# Patient Record
Sex: Female | Born: 1995 | Race: Black or African American | Hispanic: No | Marital: Married | Smoking: Never smoker
Health system: Southern US, Community
[De-identification: ages and names within clinical notes are randomized; demographics above are authoritative.]

## PROBLEM LIST (undated history)

## (undated) ENCOUNTER — Inpatient Hospital Stay (HOSPITAL_COMMUNITY): Payer: Self-pay

## (undated) DIAGNOSIS — D649 Anemia, unspecified: Secondary | ICD-10-CM

## (undated) DIAGNOSIS — B999 Unspecified infectious disease: Secondary | ICD-10-CM

## (undated) DIAGNOSIS — L309 Dermatitis, unspecified: Secondary | ICD-10-CM

## (undated) HISTORY — PX: NO PAST SURGERIES: SHX2092

---
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
## 2015-02-01 ENCOUNTER — Inpatient Hospital Stay (HOSPITAL_COMMUNITY)
Admission: AD | Admit: 2015-02-01 | Discharge: 2015-02-01 | Disposition: A | Discharge status: Home / Self Care | Payer: No Typology Code available for payment source | Source: Ambulatory Visit | Attending: Obstetrics & Gynecology | Admitting: Obstetrics & Gynecology

## 2015-02-01 ENCOUNTER — Encounter (HOSPITAL_COMMUNITY): Payer: Self-pay | Admitting: *Deleted

## 2015-02-01 DIAGNOSIS — O26892 Other specified pregnancy related conditions, second trimester: Secondary | ICD-10-CM

## 2015-02-01 DIAGNOSIS — M549 Dorsalgia, unspecified: Secondary | ICD-10-CM | POA: Diagnosis not present

## 2015-02-01 DIAGNOSIS — O26852 Spotting complicating pregnancy, second trimester: Secondary | ICD-10-CM | POA: Diagnosis present

## 2015-02-01 DIAGNOSIS — O9989 Other specified diseases and conditions complicating pregnancy, childbirth and the puerperium: Secondary | ICD-10-CM | POA: Diagnosis not present

## 2015-02-01 HISTORY — DX: Unspecified infectious disease: B99.9

## 2015-02-01 HISTORY — DX: Dermatitis, unspecified: L30.9

## 2015-02-01 HISTORY — DX: Anemia, unspecified: D64.9

## 2015-02-01 LAB — URINALYSIS, ROUTINE W REFLEX MICROSCOPIC
Bilirubin Urine: NEGATIVE
Glucose, UA: NEGATIVE mg/dL
Hgb urine dipstick: NEGATIVE
Ketones, ur: NEGATIVE mg/dL
LEUKOCYTES UA: NEGATIVE
Nitrite: NEGATIVE
PH: 8 (ref 5.0–8.0)
Protein, ur: NEGATIVE mg/dL
Specific Gravity, Urine: 1.02 (ref 1.005–1.030)
UROBILINOGEN UA: 0.2 mg/dL (ref 0.0–1.0)

## 2015-02-01 LAB — ABO/RH: ABO/RH(D): O POS

## 2015-02-01 NOTE — MAU Provider Note (Signed)
History     CSN: [id number]  Arrival date and time: 02/01/15 1147   First Provider Initiated Contact with Patient 02/01/15 1255      Chief Complaint  Patient presents with  . Vaginal Bleeding  . Abdominal Pain   HPI  [name] is a 19 y.o. G2P1002 at [redacted]w[redacted]d who presents to MAU today with complaint of light pink spotting with wiping and sharp mid back pain yesterday. She states 2 episodes of sharp shooting mid back pain yesterday without further recurrence. She also states that she noted light pink spotting on the tissue once yesterday after intercourse. She denies any further bleeding. Last intercourse was yesterday and prior to that a few days before. She denies UTI symptoms or abnormal vaginal discharge. She denies known complications with the pregnancy. She had prenatal care in [place], but recently moved to [facility] and has not seen MD yet.   OB History    Gravida Para Term Preterm AB TAB SAB Ectopic Multiple Living   0 0 0 0 0 0 2      Past Medical History  Diagnosis Date  . Anemia   . Infection     UTI  . Eczema     History reviewed. No pertinent past surgical history.  Family History  Problem Relation Age of Onset  . Heart disease Maternal Grandmother   . Diabetes Maternal Grandfather     History  Substance Use Topics  . Smoking status: Never Smoker   . Smokeless tobacco: Never Used  . Alcohol Use: No    Allergies: Allergies not on file  No prescriptions prior to admission    Review of Systems  Constitutional: Negative for fever and malaise/fatigue.  Gastrointestinal: Negative for nausea, vomiting, abdominal pain, diarrhea and constipation.  Genitourinary: Negative for dysuria, urgency and frequency.       Neg -vaginal bleeding, abnormal discharge   Physical Exam   Blood pressure 92/72, pulse 85, temperature 98.9 F (37.2 C), temperature source Oral, resp. rate 16.  Physical Exam  Constitutional: She is oriented to person, place, and time.  She appears well-developed. No distress.  HENT:  Head: Normocephalic.  Cardiovascular: Normal rate.   Respiratory: Effort normal.  GI: Soft. Bowel sounds are normal. She exhibits no distension and no mass. There is no tenderness. There is no rebound, no guarding and no CVA tenderness.  Musculoskeletal:       Thoracic back: She exhibits normal range of motion, no tenderness, no bony tenderness, no swelling, no edema and no pain.  Neurological: She is alert and oriented to person, place, and time.  Skin: Skin is warm. No erythema.  Psychiatric: She has a normal mood and affect.  Dilation: Closed Effacement (%): Thick Cervical Position: Posterior Exam by:: Raynelle Fanning PA  Results for orders placed or performed during the hospital encounter of 02/01/15 (from the past 24 hour(s))  Urinalysis, Routine w reflex microscopic     Status: None   Collection Time: 02/01/15 12:05 PM  Result Value Ref Range   Color, Urine YELLOW YELLOW   APPearance CLEAR CLEAR   Specific Gravity, Urine 1.020 1.005 - 1.030   pH 8.0 5.0 - 8.0   Glucose, UA NEGATIVE NEGATIVE mg/dL   Hgb urine dipstick NEGATIVE NEGATIVE   Bilirubin Urine NEGATIVE NEGATIVE   Ketones, ur NEGATIVE NEGATIVE mg/dL   Protein, ur NEGATIVE NEGATIVE mg/dL   Urobilinogen, UA 0.2 0.0 - 1.0 mg/dL   Nitrite NEGATIVE NEGATIVE   Leukocytes, UA  NEGATIVE NEGATIVE    MAU Course  Procedures None  MDM FHR - 147 bpm with doppler ABO/Rh and UA today  Assessment and Plan  A: SIUP at 4353w1d Back pain in pregnancy  P: Discharge home Advised Tylenol PRN for pain Second trimester warning signs discussed [facility] for [facility] due to GA. They will call the patient with an appointment Patient may return to MAU as needed or if her condition were to change or worsen    Marny LowensteinJulie N Irvine Glorioso, PA-C  02/01/2015, 1:58 PM

## 2015-02-01 NOTE — MAU Note (Signed)
Spotting and cramping yesterday, none today.  Denies prior hx of bleeding.  This is first episode.  Only US was in Jan (early pregnancy)

## 2015-02-01 NOTE — MAU Note (Signed)
R.O.I obtained, attempting to get through to office, for fax number

## 2015-02-01 NOTE — MAU Note (Signed)
Pt C/O mild uc's last night & spotting, denies these sx's today.  [place] moved [place], [facility] [place].

## 2015-02-01 NOTE — Discharge Instructions (Signed)

## 2015-02-16 ENCOUNTER — Encounter: Payer: PRIVATE HEALTH INSURANCE | Admitting: Advanced Practice Midwife

## 2015-02-16 ENCOUNTER — Telehealth: Payer: Self-pay | Admitting: Advanced Practice Midwife

## 2015-02-16 ENCOUNTER — Encounter: Payer: Self-pay | Admitting: Advanced Practice Midwife

## 2015-02-16 NOTE — Telephone Encounter (Signed)
Called left message for patient to return call to clinic. Patient missed her first prenatal visit, mailing certified letter to patient,

## 2015-02-22 ENCOUNTER — Encounter: Payer: Self-pay | Admitting: General Practice

## 2015-03-16 ENCOUNTER — Telehealth: Payer: Self-pay | Admitting: *Deleted

## 2015-03-16 ENCOUNTER — Ambulatory Visit (INDEPENDENT_AMBULATORY_CARE_PROVIDER_SITE_OTHER): Payer: PRIVATE HEALTH INSURANCE | Admitting: Advanced Practice Midwife

## 2015-03-16 ENCOUNTER — Encounter: Payer: Self-pay | Admitting: Advanced Practice Midwife

## 2015-03-16 VITALS — BP 92/57 | HR 96 | Ht 64.0 in | Wt 137.0 lb

## 2015-03-16 DIAGNOSIS — O0932 Supervision of pregnancy with insufficient antenatal care, second trimester: Secondary | ICD-10-CM

## 2015-03-16 DIAGNOSIS — B3731 Acute candidiasis of vulva and vagina: Secondary | ICD-10-CM

## 2015-03-16 DIAGNOSIS — O0990 Supervision of high risk pregnancy, unspecified, unspecified trimester: Secondary | ICD-10-CM | POA: Insufficient documentation

## 2015-03-16 DIAGNOSIS — B373 Candidiasis of vulva and vagina: Secondary | ICD-10-CM

## 2015-03-16 DIAGNOSIS — O98812 Other maternal infectious and parasitic diseases complicating pregnancy, second trimester: Secondary | ICD-10-CM

## 2015-03-16 DIAGNOSIS — Z3482 Encounter for supervision of other normal pregnancy, second trimester: Secondary | ICD-10-CM

## 2015-03-16 LAB — POCT URINALYSIS DIP (DEVICE)
BILIRUBIN URINE: NEGATIVE
Glucose, UA: NEGATIVE mg/dL
HGB URINE DIPSTICK: NEGATIVE
Ketones, ur: NEGATIVE mg/dL
Nitrite: NEGATIVE
Protein, ur: 30 mg/dL — AB
Specific Gravity, Urine: 1.025 (ref 1.005–1.030)
Urobilinogen, UA: 0.2 mg/dL (ref 0.0–1.0)
pH: 7 (ref 5.0–8.0)

## 2015-03-16 MED ORDER — TERCONAZOLE 0.4 % VA CREA: 1.0000 | TOPICAL_CREAM | Freq: Every day | VAGINAL | Status: DC

## 2015-03-16 NOTE — Progress Notes (Signed)
Flu shot declined. Had one prenatal visit in [place] in December.

## 2015-03-16 NOTE — Progress Notes (Signed)
Large leuks in urine.  

## 2015-03-16 NOTE — Telephone Encounter (Signed)
Called pt to inform her of US appt scheduled and was unable to leave message because her mailbox is full.  Pt has US scheduled on 5/2 @ 1330 and needs to be notified.

## 2015-03-16 NOTE — Patient Instructions (Signed)
Thinking About Waterbirth???  You must attend a Waterbirth class at [facility]  3rd Wednesday of every month from 7-9pm  Free  Register by calling [phone] or online at [email or web address]  Bring us the certificate from the class  Waterbirth supplies needed for [facility] patients:  Our practice has a Birth Pool in a Box tub at the hospital that you can borrow  You will need to purchase an accessory kit that has all needed supplies through [facility] (  ) or online  Or you can purchase the supplies separately: o Single-use disposable tub liner for Birth Pool in a Box (REGULAR size) o New garden hose labeled "lead-free", "suitable for drinking water", "non-toxic" OR "water potable" o Garden hose to remove the dirty water o Electric drain pump to remove water (We recommend 792 gallon per hour or greater pump.)  o Fish net o Bathing suit top (optional) o Long-handled mirror (optional)  [email or web address] sells tubs for ~ $120 if you would rather purchase your own tub  [employer] [facility] ([email or web address]) $275 for tub rental/set-up & take down/kit   Things that would prevent you from having a waterbirth:  Premature, <37wks  Previous cesarean birth  Presence of thick meconium-stained fluid  Multiple gestation (Twins, triplets, etc.)  Uncontrolled diabetes  Hypertension  Heavy vaginal bleeding  Non-reassuring fetal heart rate  Active infection (MRSA, etc.)  If your labor has to be induced  Other risk issues identified by your obstetrical provider    

## 2015-03-16 NOTE — Progress Notes (Signed)
   Subjective:    Cecelia ByarsBrittany N Silfies is a G2P1001 7967w2d being seen today for her first obstetrical visit.  Her obstetrical history is significant for NSVD x 1 . Patient does intend to breast feed. Pregnancy history fully reviewed.  Patient reports vaginal irritation and itching with white discharge.  Pt suspects yeast..  Filed Vitals:   03/16/15 1027 03/16/15 1029  BP: 92/57   Pulse: 96   Height:  5\' 4"  (1.626 m)  Weight: 137 lb (62.143 kg)     HISTORY: OB History  Gravida Para Term Preterm AB SAB TAB Ectopic Multiple Living  2 1 1  0 0 0 0 0 0 1    # Outcome Date GA Lbr Len/2nd Weight Sex Delivery Anes PTL Lv  2 Current           1 Term    7 lb 14 oz (3.572 kg) M Vag-Spont EPI N Y     Past Medical History  Diagnosis Date  . Anemia   . Infection     UTI  . Eczema    History reviewed. No pertinent past surgical history. Family History  Problem Relation Age of Onset  . Heart disease Maternal Grandmother   . Diabetes Maternal Grandfather      Exam    Uterus:     Pelvic Exam: Deferred r/t age  System: Breast:  normal appearance, no masses or tenderness   Skin: normal coloration and turgor, no rashes    Neurologic: oriented, normal, gait normal; reflexes normal and symmetric   Extremities: normal strength, tone, and muscle mass, ROM of all joints is normal   HEENT sclera clear, anicteric, neck supple with midline trachea and thyroid without masses   Mouth/Teeth mucous membranes moist, pharynx normal without lesions and dental hygiene good   Neck supple and no masses   Cardiovascular:    Respiratory:  appears well, vitals normal, no respiratory distress, acyanotic, normal RR, ear and throat exam is normal, neck free of mass or lymphadenopathy   Abdomen: soft, non-tender; bowel sounds normal; no masses,  no organomegaly   Urinary: not evaluated      Assessment:    Pregnancy: G2P1001 There are no active problems to display for this patient.       Plan:      Initial labs drawn. Prenatal vitamins. Terazol 7 sent to pharmacy Problem list reviewed and updated. Genetic Screening discussed : late to care.  Ultrasound discussed; fetal survey: ordered.  Follow up in 4 weeks.    LEFTWICH-KIRBY, Pandora Mccrackin 03/16/2015

## 2015-03-17 LAB — PRENATAL PROFILE (SOLSTAS)
ANTIBODY SCREEN: NEGATIVE
BASOS ABS: 0 10*3/uL (ref 0.0–0.1)
Basophils Relative: 0 % (ref 0–1)
EOS PCT: 1 % (ref 0–5)
Eosinophils Absolute: 0.1 10*3/uL (ref 0.0–0.7)
HCT: 29.1 % — ABNORMAL LOW (ref 36.0–46.0)
HEMOGLOBIN: 9.7 g/dL — AB (ref 12.0–15.0)
HIV 1&2 Ab, 4th Generation: NONREACTIVE
Hepatitis B Surface Ag: NEGATIVE
Lymphocytes Relative: 20 % (ref 12–46)
Lymphs Abs: 1.8 10*3/uL (ref 0.7–4.0)
MCH: 25.9 pg — AB (ref 26.0–34.0)
MCHC: 33.3 g/dL (ref 30.0–36.0)
MCV: 77.6 fL — ABNORMAL LOW (ref 78.0–100.0)
MPV: 10.2 fL (ref 8.6–12.4)
Monocytes Absolute: 0.5 10*3/uL (ref 0.1–1.0)
Monocytes Relative: 6 % (ref 3–12)
NEUTROS PCT: 73 % (ref 43–77)
Neutro Abs: 6.6 10*3/uL (ref 1.7–7.7)
PLATELETS: 295 10*3/uL (ref 150–400)
RBC: 3.75 MIL/uL — AB (ref 3.87–5.11)
RDW: 13.6 % (ref 11.5–15.5)
Rh Type: POSITIVE
Rubella: 2.01 Index — ABNORMAL HIGH (ref <0.90)
WBC: 9.1 10*3/uL (ref 4.0–10.5)

## 2015-03-17 NOTE — Telephone Encounter (Signed)
Called patient and informed her of appt. Patient verbalized understanding and had no questions 

## 2015-03-18 LAB — PRESCRIPTION MONITORING PROFILE (19 PANEL)
Amphetamine/Meth: NEGATIVE ng/mL
Barbiturate Screen, Urine: NEGATIVE ng/mL
Benzodiazepine Screen, Urine: NEGATIVE ng/mL
Buprenorphine, Urine: NEGATIVE ng/mL
COCAINE METABOLITES: NEGATIVE ng/mL
Cannabinoid Scrn, Ur: NEGATIVE ng/mL
Carisoprodol, Urine: NEGATIVE ng/mL
Creatinine, Urine: 271.38 mg/dL (ref >20.0)
Fentanyl, Ur: NEGATIVE ng/mL
MDMA URINE: NEGATIVE ng/mL
METHADONE SCREEN, URINE: NEGATIVE ng/mL
Meperidine, Ur: NEGATIVE ng/mL
Methaqualone: NEGATIVE ng/mL
Nitrites, Initial: NEGATIVE ug/mL
OXYCODONE SCRN UR: NEGATIVE ng/mL
Opiate Screen, Urine: NEGATIVE ng/mL
PHENCYCLIDINE, UR: NEGATIVE ng/mL
PROPOXYPHENE: NEGATIVE ng/mL
TAPENTADOLUR: NEGATIVE ng/mL
Tramadol Scrn, Ur: NEGATIVE ng/mL
ZOLPIDEM, URINE: NEGATIVE ng/mL
pH, Initial: 7.3 pH (ref 4.5–8.9)

## 2015-03-18 LAB — CULTURE, OB URINE

## 2015-03-21 ENCOUNTER — Ambulatory Visit (HOSPITAL_COMMUNITY)
Admission: RE | Admit: 2015-03-21 | Discharge: 2015-03-21 | Disposition: A | Discharge status: Home / Self Care | Payer: Self-pay | Source: Ambulatory Visit | Attending: Advanced Practice Midwife | Admitting: Advanced Practice Midwife

## 2015-03-21 DIAGNOSIS — Z3A25 25 weeks gestation of pregnancy: Secondary | ICD-10-CM | POA: Insufficient documentation

## 2015-03-21 DIAGNOSIS — O0932 Supervision of pregnancy with insufficient antenatal care, second trimester: Secondary | ICD-10-CM | POA: Insufficient documentation

## 2015-03-21 DIAGNOSIS — Z36 Encounter for antenatal screening of mother: Secondary | ICD-10-CM | POA: Insufficient documentation

## 2015-03-21 DIAGNOSIS — Z3689 Encounter for other specified antenatal screening: Secondary | ICD-10-CM | POA: Insufficient documentation

## 2015-04-06 ENCOUNTER — Ambulatory Visit (INDEPENDENT_AMBULATORY_CARE_PROVIDER_SITE_OTHER): Payer: PRIVATE HEALTH INSURANCE | Admitting: Physician Assistant

## 2015-04-06 ENCOUNTER — Encounter: Payer: Self-pay | Admitting: Physician Assistant

## 2015-04-06 VITALS — BP 109/81 | HR 92 | Wt 140.9 lb

## 2015-04-06 DIAGNOSIS — Z3483 Encounter for supervision of other normal pregnancy, third trimester: Secondary | ICD-10-CM

## 2015-04-06 LAB — POCT URINALYSIS DIP (DEVICE)
BILIRUBIN URINE: NEGATIVE
Glucose, UA: NEGATIVE mg/dL
Hgb urine dipstick: NEGATIVE
Ketones, ur: NEGATIVE mg/dL
Nitrite: NEGATIVE
PH: 6.5 (ref 5.0–8.0)
PROTEIN: NEGATIVE mg/dL
SPECIFIC GRAVITY, URINE: 1.02 (ref 1.005–1.030)
Urobilinogen, UA: 0.2 mg/dL (ref 0.0–1.0)

## 2015-04-06 LAB — CBC
HCT: 29.9 % — ABNORMAL LOW (ref 36.0–46.0)
HEMOGLOBIN: 9.8 g/dL — AB (ref 12.0–15.0)
MCH: 25.6 pg — ABNORMAL LOW (ref 26.0–34.0)
MCHC: 32.8 g/dL (ref 30.0–36.0)
MCV: 78.1 fL (ref 78.0–100.0)
MPV: 10.3 fL (ref 8.6–12.4)
PLATELETS: 237 10*3/uL (ref 150–400)
RBC: 3.83 MIL/uL — ABNORMAL LOW (ref 3.87–5.11)
RDW: 14.9 % (ref 11.5–15.5)
WBC: 8.9 10*3/uL (ref 4.0–10.5)

## 2015-04-06 MED ORDER — TETANUS-DIPHTH-ACELL PERTUSSIS 5-2.5-18.5 LF-MCG/0.5 IM SUSP: 0.5000 mL | Freq: Once | INTRAMUSCULAR | Status: DC

## 2015-04-06 NOTE — Patient Instructions (Signed)
Pain Relief During Labor and Delivery Everyone experiences pain differently, but labor causes severe pain for many women. The amount of pain you experience during labor and delivery depends on your pain tolerance, contraction strength, and your baby's size and position. There are many ways to prepare for and deal with the pain, including:   Taking prenatal classes to learn about labor and delivery. The more informed you are, the less anxious and afraid you may be. This can help lessen the pain.  Taking pain-relieving medicine during labor and delivery.  Learning breathing and relaxation techniques.  Taking a shower or bath.  Getting massaged.  Changing positions.  Placing an ice pack on your back. Discuss your pain control options with your health care provider during your prenatal visits.  WHAT ARE THE TWO TYPES OF PAIN-RELIEVING MEDICINES? 1. Analgesics. These are medicines that decrease pain without total loss of feeling or muscle movement. 2. Anesthetics. These are medicines that block all feeling, including pain. There can be minor side effects of both types, such as nausea, trouble concentrating, becoming sleepy, and lowering the heart rate of the baby. However, health care providers are careful to give doses that will not seriously affect the baby.  WHAT ARE THE SPECIFIC TYPES OF ANALGESICS AND ANESTHETICS? Systemic Analgesic Systemic pain medicines affect your whole body rather than focusing pain relief on the area of your body experiencing pain. This type of medicine is given either through an IV tube in your vein or by a shot (injection) into your muscle. This medicine will lessen your pain but will not stop it completely. It may also make you sleepy, but it will not make you lose consciousness.  Local Anesthetic Local anesthetic isused tonumb a small area of your body. The medicine is injected into the area of nerves that carry feeling to the vagina, vulva, or the area between  the vagina and anus (perineum).  General Anesthetic This type of medicine causes you to lose consciousness so you do not feel pain. It is usually used only in emergency situations during labor. It is given through an IV tube or face mask. Paracervical Block A paracervical block is a form of local anesthesia given during labor. Numbing medicine is injected into the right and left sides of the cervix and vagina. It helps to lessen the pain caused by contractions and stretching of the cervix. It may have to be given more than once.  Pudendal Block A pudendal block is another form of local anesthesia. It is used to relieve the pain associated with pushing or stretching of the perineum at the time of delivery. An injection is given deep through the vaginal wall into the pudendal nerve in the pelvis, numbing the perineum.  Epidural Anesthetic An epidural is an injection of numbing medicine given in the lower back and into the epidural space near your spinal cord. The epidural numbs the lower half of your body. You may be able to move your legs but will not be allowed to walk. Epidurals can be used for labor, delivery, or cesarean deliveries.  To prevent the medicine from wearing off, a small tube (catheter) may be threaded into the epidural space and taped in place to prevent it from slipping out. Medicine can then be given continuously in small doses through the tube until you deliver. Spinal Block A spinal block is similar to an epidural, but the medicine is injected into the spinal fluid, not the epidural space. A spinal block is only given   once. It starts to relieve pain quickly but lasts only 1-2 hours. Spinal blocks can also be used for cesarean deliveries.  Combined Spinal-Epidural Block Combined spinal-epidural blocks combine the benefits of both the spinal and epidural blocks. The spinal part acts quickly to relieve pain and the epidural provides continuous pain relief. Hydrotherapy Immersion in  warm water during labor may provide comfort and relaxation. It may also help to lessen pain, the use of anesthesia, and the length of labor. However, immersion in water during the delivery (water birth) may have some risk involved and studies to determine safety and risks are ongoing. If you are a healthy woman who is expecting an uncomplicated birth, talk with your health care provider to see if water birth is an option for you.  Document Released: 02/21/2009 Document Revised: 11/10/2013 Document Reviewed: 03/26/2013 ExitCare Patient Information 2015 [employer]. This information is not intended to replace advice given to you by your health care provider. Make sure you discuss any questions you have with your health care provider.  

## 2015-04-06 NOTE — Progress Notes (Signed)
Small leuks on udip.   1hr GTT, 28 wk labs and Tdap today.

## 2015-04-06 NOTE — Progress Notes (Signed)
27 weeks, stable.  Denies LOF, VB, dysuria.  Endorses good fetal movement.  Occasionally with mild ctx.  28 week labs, Tdap today  Advised to sign up for waterbirth class asap.   RTC 2 weeks

## 2015-04-07 LAB — RPR

## 2015-04-07 LAB — HIV ANTIBODY (ROUTINE TESTING W REFLEX): HIV 1&2 Ab, 4th Generation: NONREACTIVE

## 2015-04-07 LAB — GLUCOSE TOLERANCE, 1 HOUR (50G) W/O FASTING: Glucose, 1 Hour GTT: 157 mg/dL — ABNORMAL HIGH (ref 70–140)

## 2015-04-12 ENCOUNTER — Telehealth: Payer: Self-pay

## 2015-04-12 NOTE — Telephone Encounter (Signed)
-----   Message from Bertram DenverKaren E Teague Clark, PA-C sent at 04/12/2015  4:01 PM EDT ----- Please call pt to inform of failed 1 hour GTT.  Will need 3 hour GTT scheduled KTC

## 2015-04-12 NOTE — Telephone Encounter (Signed)
Attempted to contact patient. No answer. Left message stating we are calling with results, please call clinic.  

## 2015-04-13 NOTE — Telephone Encounter (Signed)
Called patient and informed her of 1 hr gtt and need for 3 hr glucose test. Explained she will need to come in fasting (nothing to eat or drink after midnight) and she will need to arrive around 8-830. Patient verbalized understanding and states she can come in Tuesday 5/31 @ 8am. Patient had no questions

## 2015-04-19 ENCOUNTER — Other Ambulatory Visit: Payer: PRIVATE HEALTH INSURANCE

## 2015-04-20 ENCOUNTER — Encounter: Payer: PRIVATE HEALTH INSURANCE | Admitting: Physician Assistant

## 2015-04-21 ENCOUNTER — Encounter: Payer: Self-pay | Admitting: Family Medicine

## 2015-05-03 ENCOUNTER — Other Ambulatory Visit: Payer: Self-pay

## 2015-05-06 ENCOUNTER — Ambulatory Visit (INDEPENDENT_AMBULATORY_CARE_PROVIDER_SITE_OTHER): Payer: PRIVATE HEALTH INSURANCE | Admitting: Family Medicine

## 2015-05-06 VITALS — BP 94/76 | HR 82 | Temp 98.5°F | Wt 142.7 lb

## 2015-05-06 DIAGNOSIS — Z3483 Encounter for supervision of other normal pregnancy, third trimester: Secondary | ICD-10-CM

## 2015-05-06 LAB — POCT URINALYSIS DIP (DEVICE)
Glucose, UA: NEGATIVE mg/dL
Hgb urine dipstick: NEGATIVE
Ketones, ur: NEGATIVE mg/dL
Nitrite: NEGATIVE
Protein, ur: 30 mg/dL — AB
SPECIFIC GRAVITY, URINE: 1.025 (ref 1.005–1.030)
UROBILINOGEN UA: 1 mg/dL (ref 0.0–1.0)
pH: 7 (ref 5.0–8.0)

## 2015-05-06 NOTE — Progress Notes (Signed)
Patient is 19 y.o. G2P1001 [redacted]w[redacted]d.  +FM, denies LOF, VB, contractions, vaginal discharge.  - leg cramps: discussed PO hydration - abn 1h gtt: coming on Monday to be instructed in fasting glucose x 1 week - still has not signed up for waterbirth class, advised to do so.  Needs to be consented by midwife as well.

## 2015-05-06 NOTE — Progress Notes (Signed)
C/o calves cramping.

## 2015-05-09 ENCOUNTER — Ambulatory Visit: Payer: Self-pay

## 2015-05-18 ENCOUNTER — Encounter: Payer: Self-pay | Admitting: Advanced Practice Midwife

## 2015-05-18 ENCOUNTER — Ambulatory Visit (INDEPENDENT_AMBULATORY_CARE_PROVIDER_SITE_OTHER): Payer: Self-pay | Admitting: Advanced Practice Midwife

## 2015-05-18 VITALS — BP 100/68 | HR 83 | Temp 97.7°F | Wt 143.6 lb

## 2015-05-18 DIAGNOSIS — R7302 Impaired glucose tolerance (oral): Secondary | ICD-10-CM

## 2015-05-18 DIAGNOSIS — R7309 Other abnormal glucose: Secondary | ICD-10-CM

## 2015-05-18 DIAGNOSIS — Z3483 Encounter for supervision of other normal pregnancy, third trimester: Secondary | ICD-10-CM

## 2015-05-18 LAB — POCT URINALYSIS DIP (DEVICE)
BILIRUBIN URINE: NEGATIVE
Glucose, UA: NEGATIVE mg/dL
HGB URINE DIPSTICK: NEGATIVE
Ketones, ur: NEGATIVE mg/dL
Nitrite: NEGATIVE
PH: 6 (ref 5.0–8.0)
Protein, ur: NEGATIVE mg/dL
SPECIFIC GRAVITY, URINE: 1.015 (ref 1.005–1.030)
Urobilinogen, UA: 0.2 mg/dL (ref 0.0–1.0)

## 2015-05-18 NOTE — Progress Notes (Signed)
Medical, Surgical, Family and Social histories reviewed and are listed above.  Medications and allergies reviewed.  Has not checked FBS yet, has not had training. Will schedule her with Diab management this week. Will check growth US next week in case she is diabetic. Will scan waterbirth certificate.   Informed her we might not be able to do WB if she is diabetic.

## 2015-05-18 NOTE — Patient Instructions (Signed)
Glucose Tolerance Test During Pregnancy The glucose tolerance test (GTT) or 3-hour glucose test can be used to determine if a woman has diabetes that first begins or is first recognized during pregnancy (gestational diabetes). Typically, a GTT is done after you have had a 1-hour glucose test with results that indicate you possibly have gestational diabetes.  The test takes about 3 hours. There will be a series of blood tests after you drink the sugar-water solution. You must remain at the testing location to make sure that your blood is drawn on time.  LET YOUR HEALTH CARE PROVIDER KNOW ABOUT:  Allergies to food or medicine.  Medicines taken, including vitamins, herbs, eyedrops, over-the-counter medicines, and creams.  Any recent illnesses or infections. BEFORE THE PROCEDURE The GTT is a fasting test, meaning you must stop eating for a certain amount of time. The test will be the most accurate if you have not eaten for 8-12 hours before the test. For this reason, it is recommended that you have this test done in the morning before you have breakfast. PROCEDURE  When you arrive at the lab, a sample of your blood is taken to get your fasting blood glucose level. After your fasting glucose level is determined, you will be given a sugar-water solution to drink. You will be asked to wait in a certain area until your next blood test. The blood tests are done each hour for 3 hours. Stay close to the lab so your blood samples can be taken on time. This is important. If the blood samples are not taken on time, the test will need to be done again on another day.  AFTER THE PROCEDURE  You can eat and drink as usual.   Ask when your test results will be ready. Make sure you get your test results. A positive test is considered when two of the four blood test values are equal or above the normal blood glucose level. Document Released: 05/06/2012 Document Revised: 03/22/2014 Document Reviewed:  03/12/2014 ExitCare Patient Information 2015 [employer]. This information is not intended to replace advice given to you by your health care provider. Make sure you discuss any questions you have with your health care provider.  

## 2015-05-18 NOTE — Progress Notes (Signed)
Is doing cbg's at home, is supposed to reschedule appointment with diabetes educator- Patient states can't come this Thursday, but agreed to 05/26/15 appointment with Diabetes educator in MFM.

## 2015-05-26 ENCOUNTER — Ambulatory Visit (HOSPITAL_COMMUNITY): Payer: Self-pay

## 2015-05-26 ENCOUNTER — Ambulatory Visit (HOSPITAL_COMMUNITY)
Admission: RE | Admit: 2015-05-26 | Discharge: 2015-05-26 | Disposition: A | Discharge status: Home / Self Care | Payer: Medicaid Other | Source: Ambulatory Visit | Attending: Advanced Practice Midwife | Admitting: Advanced Practice Midwife

## 2015-05-26 ENCOUNTER — Other Ambulatory Visit: Payer: Self-pay | Admitting: Advanced Practice Midwife

## 2015-05-26 DIAGNOSIS — Z3A34 34 weeks gestation of pregnancy: Secondary | ICD-10-CM | POA: Insufficient documentation

## 2015-05-26 DIAGNOSIS — O403XX1 Polyhydramnios, third trimester, fetus 1: Secondary | ICD-10-CM

## 2015-05-26 DIAGNOSIS — Z3483 Encounter for supervision of other normal pregnancy, third trimester: Secondary | ICD-10-CM

## 2015-05-26 DIAGNOSIS — IMO0002 Reserved for concepts with insufficient information to code with codable children: Secondary | ICD-10-CM

## 2015-05-26 DIAGNOSIS — O0933 Supervision of pregnancy with insufficient antenatal care, third trimester: Secondary | ICD-10-CM | POA: Diagnosis not present

## 2015-05-26 DIAGNOSIS — O24419 Gestational diabetes mellitus in pregnancy, unspecified control: Secondary | ICD-10-CM | POA: Insufficient documentation

## 2015-05-26 DIAGNOSIS — O409XX0 Polyhydramnios, unspecified trimester, not applicable or unspecified: Secondary | ICD-10-CM | POA: Insufficient documentation

## 2015-05-26 DIAGNOSIS — O24410 Gestational diabetes mellitus in pregnancy, diet controlled: Secondary | ICD-10-CM | POA: Insufficient documentation

## 2015-05-26 DIAGNOSIS — O99810 Abnormal glucose complicating pregnancy: Secondary | ICD-10-CM | POA: Insufficient documentation

## 2015-05-27 ENCOUNTER — Ambulatory Visit (HOSPITAL_COMMUNITY): Payer: Self-pay

## 2015-05-29 DIAGNOSIS — IMO0002 Reserved for concepts with insufficient information to code with codable children: Secondary | ICD-10-CM | POA: Insufficient documentation

## 2015-05-30 ENCOUNTER — Telehealth: Payer: Self-pay | Admitting: General Practice

## 2015-05-30 DIAGNOSIS — IMO0002 Reserved for concepts with insufficient information to code with codable children: Secondary | ICD-10-CM

## 2015-05-30 NOTE — Telephone Encounter (Signed)
-----   Message from Aviva SignsMarie L Williams, CNM sent at 05/29/2015 12:41 PM EDT ----- Regarding: Needs MFM appt for f/u US US saw Pericardial Effusion on US with Polyhydramnios  Needs F/U US in MFM this week (per MFM) to check effusion, may need further workup like fetal echo  Needs to move to High Risk   Thanks  Wynelle BourgeoisMarie Williams

## 2015-05-30 NOTE — Telephone Encounter (Signed)
Scheduled appt for 7/13 @ 1:15. Called patient and informed her of appt. Patient verbalized understanding and had no questions

## 2015-06-01 ENCOUNTER — Ambulatory Visit (HOSPITAL_COMMUNITY)
Admission: RE | Admit: 2015-06-01 | Discharge: 2015-06-01 | Disposition: A | Discharge status: Home / Self Care | Payer: Medicaid Other | Source: Ambulatory Visit | Attending: Advanced Practice Midwife | Admitting: Advanced Practice Midwife

## 2015-06-01 ENCOUNTER — Other Ambulatory Visit: Payer: Self-pay | Admitting: General Practice

## 2015-06-01 ENCOUNTER — Encounter (HOSPITAL_COMMUNITY): Payer: Self-pay

## 2015-06-01 VITALS — BP 101/63 | HR 91 | Wt 148.4 lb

## 2015-06-01 DIAGNOSIS — IMO0002 Reserved for concepts with insufficient information to code with codable children: Secondary | ICD-10-CM

## 2015-06-01 DIAGNOSIS — O403XX0 Polyhydramnios, third trimester, not applicable or unspecified: Secondary | ICD-10-CM | POA: Insufficient documentation

## 2015-06-01 DIAGNOSIS — O403XX1 Polyhydramnios, third trimester, fetus 1: Secondary | ICD-10-CM

## 2015-06-01 DIAGNOSIS — O0933 Supervision of pregnancy with insufficient antenatal care, third trimester: Secondary | ICD-10-CM | POA: Diagnosis not present

## 2015-06-01 DIAGNOSIS — O24419 Gestational diabetes mellitus in pregnancy, unspecified control: Secondary | ICD-10-CM | POA: Diagnosis not present

## 2015-06-01 DIAGNOSIS — O24410 Gestational diabetes mellitus in pregnancy, diet controlled: Secondary | ICD-10-CM

## 2015-06-01 DIAGNOSIS — Z3A35 35 weeks gestation of pregnancy: Secondary | ICD-10-CM | POA: Diagnosis not present

## 2015-06-01 NOTE — ED Notes (Signed)
Pt states she had previously declined her 3hr GTT.  States she did not want it.  She also states she has not been doing cbgs at home.  States she was waiting on diabetic educator to learn this.  She is scheduled to see Harriett SineNancy tomorrow for diabetes education.  I called the clinic and spoke with Dr. Adrian BlackwaterStinson and requested a BPP today with the patients diagnosis of poly and unsure of diabetes status.

## 2015-06-02 ENCOUNTER — Ambulatory Visit (HOSPITAL_COMMUNITY)
Admission: RE | Admit: 2015-06-02 | Discharge: 2015-06-02 | Disposition: A | Discharge status: Home / Self Care | Payer: Medicaid Other | Source: Ambulatory Visit | Attending: Advanced Practice Midwife | Admitting: Advanced Practice Midwife

## 2015-06-02 ENCOUNTER — Encounter: Payer: Medicaid Other | Attending: Advanced Practice Midwife | Admitting: *Deleted

## 2015-06-02 ENCOUNTER — Encounter: Payer: Self-pay | Admitting: Obstetrics and Gynecology

## 2015-06-02 DIAGNOSIS — Z713 Dietary counseling and surveillance: Secondary | ICD-10-CM | POA: Insufficient documentation

## 2015-06-02 DIAGNOSIS — O24410 Gestational diabetes mellitus in pregnancy, diet controlled: Secondary | ICD-10-CM

## 2015-06-02 MED ORDER — GLUCOSE BLOOD VI STRP: ORAL_STRIP | Status: DC

## 2015-06-02 MED ORDER — ACCU-CHEK FASTCLIX LANCETS MISC: 1.0000 | Freq: Four times a day (QID) | Status: DC

## 2015-06-02 NOTE — Progress Notes (Signed)
  Patient was seen on 06/02/15 for Gestational Diabetes self-management . The following learning objectives were met by the patient :   States the definition of Gestational Diabetes  States why dietary management is important in controlling blood glucose  Describes the effects of carbohydrates on blood glucose levels  Demonstrates ability to create a balanced meal plan  Demonstrates carbohydrate counting   States when to check blood glucose levels  Demonstrates proper blood glucose monitoring techniques  States the effect of stress and exercise on blood glucose levels  States the importance of limiting caffeine and abstaining from alcohol and smoking  Plan:  Aim for 2 Carb Choices per meal (30 grams) +/- 1 either way for breakfast Aim for 3 Carb Choices per meal (45 grams) +/- 1 either way from lunch and dinner Aim for 1-2 Carbs per snack Begin reading food labels for Total Carbohydrate and sugar grams of foods Consider  increasing your activity level by walking daily as tolerated Begin checking BG before breakfast and 2 hours after first bit of breakfast, lunch and dinner after  as directed by MD  Take medication  as directed by MD  Blood glucose monitor given: [employer] Lot # [id number] Exp: 03/18/2016 Blood glucose reading: 121m/dl 310m pp granola bar  Patient instructed to monitor glucose levels: FBS: 60 - <90 2 hour: <120  Patient received the following handouts:  Nutrition Diabetes and Pregnancy  Carbohydrate Counting List  Meal Planning worksheet  Patient will be seen for follow-up as needed.

## 2015-06-11 ENCOUNTER — Encounter (HOSPITAL_COMMUNITY): Payer: Self-pay | Admitting: *Deleted

## 2015-06-11 ENCOUNTER — Inpatient Hospital Stay (HOSPITAL_COMMUNITY)
Admission: AD | Admit: 2015-06-11 | Discharge: 2015-06-11 | Disposition: A | Discharge status: Home / Self Care | Payer: Medicaid Other | Source: Ambulatory Visit | Attending: Obstetrics & Gynecology | Admitting: Obstetrics & Gynecology

## 2015-06-11 DIAGNOSIS — O368130 Decreased fetal movements, third trimester, not applicable or unspecified: Secondary | ICD-10-CM | POA: Insufficient documentation

## 2015-06-11 DIAGNOSIS — Z3A36 36 weeks gestation of pregnancy: Secondary | ICD-10-CM | POA: Insufficient documentation

## 2015-06-11 DIAGNOSIS — O0933 Supervision of pregnancy with insufficient antenatal care, third trimester: Secondary | ICD-10-CM | POA: Diagnosis not present

## 2015-06-11 LAB — GLUCOSE, CAPILLARY: Glucose-Capillary: 74 mg/dL (ref 65–99)

## 2015-06-11 NOTE — Discharge Instructions (Signed)

## 2015-06-11 NOTE — MAU Provider Note (Signed)
History     CSN: [id number]  Arrival date and time: 06/11/15 1521   None     Chief Complaint  Patient presents with  . Decreased Fetal Movement   HPI  [place] is a G2P1001 at [redacted]w[redacted]d presenting for decreased perceived FM and dizziness x 12h. Pt woke up at 0430 to urinate, and felt hot and dizzy. Continues to feel dizzy and like her head is heavy, especially when standing quickly. Pt had two episodes of NBNB emesis; both occurred while eating pancakes. Pt continues to feel nauseous and has not had anything to eat or drink since late this am. Endorses palpitations, intermittent contractions. Endorses polyuria (every 2h) and polydipsia (although pt had one glass of OJ today; no other fluids). Denies fever, UTI symptoms, diarrhea, or constipation.  Pt currently perceives FM and small rare contractions. Reports discharge yesterday that was thick with a pink tinge.  Denies VB, LOF.  OB History    Gravida Para Term Preterm AB TAB SAB Ectopic Multiple Living   2 1 1  0 0 0 0 0 0 1      Past Medical History  Diagnosis Date  . Anemia   . Infection     UTI  . Eczema     Past Surgical History  Procedure Laterality Date  . No past surgeries      Family History  Problem Relation Age of Onset  . Heart disease Maternal Grandmother   . Diabetes Maternal Grandfather     History  Substance Use Topics  . Smoking status: Never Smoker   . Smokeless tobacco: Never Used  . Alcohol Use: No    Allergies: No Known Allergies  Prescriptions prior to admission  Medication Sig Dispense Refill Last Dose  . ferrous sulfate 325 (65 FE) MG tablet Take 325 mg by mouth daily.    06/10/2015 at Unknown time  . Prenatal Vit-Fe Fumarate-FA (PRENATAL VITAMINS) 28-0.8 MG TABS Take 1 tablet by mouth daily.   06/10/2015 at Unknown time  . ACCU-CHEK FASTCLIX LANCETS MISC Inject 1 each into the skin 4 (four) times daily. DX GDM O24.419 for testing 4 times daily 102 each 12   . glucose blood  (ACCU-CHEK AVIVA) test strip Accu-chek Aviva Plus test strips DX GDM   O24.419  For testing 4 times daily 100 each 12     Review of Systems  Constitutional: Positive for malaise/fatigue and diaphoresis. Negative for fever and chills.  Eyes: Negative for blurred vision and double vision.  Cardiovascular: Positive for palpitations. Negative for chest pain.  Gastrointestinal: Positive for nausea and vomiting. Negative for abdominal pain, diarrhea and constipation.  Genitourinary: Positive for frequency. Negative for dysuria and urgency.  Musculoskeletal: Positive for back pain. Negative for myalgias.  Neurological: Positive for dizziness and weakness. Negative for headaches.   Physical Exam   Blood pressure 94/61, pulse 91, temperature 98 F (36.7 C), temperature source Oral, resp. rate 16, height 5' 5.5" (1.664 m), weight 67.302 kg (148 lb 6 oz).  Physical Exam  Constitutional: She is oriented to person, place, and time. She appears well-developed and well-nourished. No distress.  HENT:  Head: Normocephalic and atraumatic.  Eyes: Conjunctivae and EOM are normal.  Cardiovascular: Normal rate, regular rhythm, normal heart sounds and intact distal pulses.  Exam reveals no gallop and no friction rub.   No murmur heard. Respiratory: Effort normal and breath sounds normal. No respiratory distress. She has no wheezes. She has no rales.  GI: There is no tenderness.  There is no rebound.  Gravid  Musculoskeletal: She exhibits no edema.  Neurological: She is alert and oriented to person, place, and time.  Skin: Skin is warm and dry. She is not diaphoretic.  Psychiatric: Her behavior is normal. Thought content normal.    MAU Course  Procedures  MDM FHM reassuring.  CBG within normal range.   Assessment and Plan  SIUP at [redacted]w[redacted]d.  Limited prenatal care.  Encourage liquid intake.  Term labor symptoms discussed.  Discharge home.  Parks Ranger 06/11/2015, 4:34 PM

## 2015-06-11 NOTE — MAU Note (Addendum)
Patient presents at [redacted] weeks gestation with c/o decreased fetal movement since 0430 this morning. States she also vomited X2 today followed by an episode of lightheadedness. Denies bleeding or discharge.

## 2015-06-11 NOTE — MAU Note (Signed)
Assumed care of patient.

## 2015-06-27 ENCOUNTER — Other Ambulatory Visit: Payer: Self-pay | Admitting: Obstetrics and Gynecology

## 2015-06-27 ENCOUNTER — Encounter: Payer: Self-pay | Admitting: *Deleted

## 2015-06-27 ENCOUNTER — Ambulatory Visit (INDEPENDENT_AMBULATORY_CARE_PROVIDER_SITE_OTHER): Payer: Medicaid Other | Admitting: Obstetrics and Gynecology

## 2015-06-27 VITALS — BP 113/66 | HR 89 | Temp 98.5°F | Wt 150.7 lb

## 2015-06-27 DIAGNOSIS — O0993 Supervision of high risk pregnancy, unspecified, third trimester: Secondary | ICD-10-CM | POA: Diagnosis present

## 2015-06-27 DIAGNOSIS — O99810 Abnormal glucose complicating pregnancy: Secondary | ICD-10-CM

## 2015-06-27 DIAGNOSIS — O0930 Supervision of pregnancy with insufficient antenatal care, unspecified trimester: Secondary | ICD-10-CM | POA: Insufficient documentation

## 2015-06-27 DIAGNOSIS — O0933 Supervision of pregnancy with insufficient antenatal care, third trimester: Secondary | ICD-10-CM

## 2015-06-27 DIAGNOSIS — O24410 Gestational diabetes mellitus in pregnancy, diet controlled: Secondary | ICD-10-CM

## 2015-06-27 DIAGNOSIS — O24419 Gestational diabetes mellitus in pregnancy, unspecified control: Secondary | ICD-10-CM | POA: Diagnosis not present

## 2015-06-27 DIAGNOSIS — O0990 Supervision of high risk pregnancy, unspecified, unspecified trimester: Secondary | ICD-10-CM

## 2015-06-27 LAB — GLUCOSE, CAPILLARY: GLUCOSE-CAPILLARY: 75 mg/dL (ref 65–99)

## 2015-06-27 LAB — POCT URINALYSIS DIP (DEVICE)
Bilirubin Urine: NEGATIVE
Glucose, UA: NEGATIVE mg/dL
Hgb urine dipstick: NEGATIVE
Ketones, ur: NEGATIVE mg/dL
NITRITE: NEGATIVE
Protein, ur: NEGATIVE mg/dL
Specific Gravity, Urine: 1.015 (ref 1.005–1.030)
Urobilinogen, UA: 0.2 mg/dL (ref 0.0–1.0)
pH: 7 (ref 5.0–8.0)

## 2015-06-27 LAB — OB RESULTS CONSOLE GBS: GBS: NEGATIVE

## 2015-06-27 NOTE — Progress Notes (Signed)
Reviewed tip of week with patient  Fetal movement seen during visit

## 2015-06-27 NOTE — Progress Notes (Signed)
Subjective:  Mia James is a 19 y.o. G2P1001 at [redacted]w[redacted]d being seen today for ongoing prenatal care. Doesn't bring sugar log. Checks 3x daily. Morning fastings in 80s per report. 2-hour PPs 110-120. Only one has been > 120. No fastings greater than 95.Contractions: Irregular.  Vag. Bleeding: None. Movement: (!) Decreased. Says last did kick count last night, passed. Denies leaking of fluid.   The following portions of the patient's history were reviewed and updated as appropriate: allergies, current medications, past family history, past medical history, past social history, past surgical history and problem list.   Objective:   Filed Vitals:   06/27/15 1146  BP: 113/66  Pulse: 89  Temp: 98.5 F (36.9 C)  Weight: 150 lb 11.2 oz (68.357 kg)    Fetal Status: Fetal Heart Rate (bpm): 161   Movement: (!) Decreased     General:  Alert, oriented and cooperative. Patient is in no acute distress.  Skin: Skin is warm and dry. No rash noted.   Cardiovascular: Normal heart rate noted  Respiratory: Normal respiratory effort, no problems with respiration noted  Abdomen: Soft, gravid, appropriate for gestational age. Pain/Pressure: Present     Pelvic: Vag. Bleeding: None     Cervical exam deferred        Extremities: Normal range of motion.  Edema: None  Mental Status: Normal mood and affect. Normal behavior. Normal judgment and thought content.   Urinalysis:      Assessment and Plan:  Pregnancy: G2P1001 at [redacted]w[redacted]d  1. Limited prenatal care - continue pnv - gbs/gc/chlamydia today  2. Elevated GTT - initially declined 3-hour & has been managed as diet-controlled, though has not presented for prenatal care - I explained we need the 3-hour to make or not make a diagnosis, as this affects mgmt - present tomorrow morning for 3-hour, and for AFI/NST (in case pt is in fact diabetic) - holding on growth scan, but if indicated will attempt to obtain expeditiously. Would need IOL.  3. Borderline  polyhydramnios - afi with nst tomorrow  4. Water birth - class completed; consents signed and placed to be scanned today (research consent also signed)  Term labor symptoms and general obstetric precautions including but not limited to vaginal bleeding, contractions, leaking of fluid and fetal movement were reviewed in detail with the patient. Please refer to After Visit Summary for other counseling recommendations.  No Follow-up on file.   Kathrynn Running, MD

## 2015-06-28 ENCOUNTER — Ambulatory Visit (INDEPENDENT_AMBULATORY_CARE_PROVIDER_SITE_OTHER): Payer: Medicaid Other | Admitting: *Deleted

## 2015-06-28 VITALS — BP 103/64 | HR 85

## 2015-06-28 DIAGNOSIS — O0933 Supervision of pregnancy with insufficient antenatal care, third trimester: Secondary | ICD-10-CM | POA: Diagnosis present

## 2015-06-28 DIAGNOSIS — O0990 Supervision of high risk pregnancy, unspecified, unspecified trimester: Secondary | ICD-10-CM

## 2015-06-28 DIAGNOSIS — O99810 Abnormal glucose complicating pregnancy: Secondary | ICD-10-CM | POA: Diagnosis not present

## 2015-06-28 LAB — GC/CHLAMYDIA PROBE AMP
CT Probe RNA: NEGATIVE
GC Probe RNA: NEGATIVE

## 2015-06-28 NOTE — Addendum Note (Signed)
Addended by: Faythe Casa on: 06/28/2015 03:00 PM   Modules accepted: Orders

## 2015-06-28 NOTE — Progress Notes (Signed)
Pt continues to report decreased FM - states hours go by with no FM, however she has felt good FM this morning.  AFI today measures 19.3 cm however a single pocket measured 10.1 cm which constitutes polyhydramnios. NST scheduled for 8/12 pending other medical management depending on 3hr GTT results from today.

## 2015-06-29 LAB — GLUCOSE TOLERANCE, 3 HOURS
Glucose Tolerance, 1 hour: 128 mg/dL (ref 70–189)
Glucose Tolerance, 2 hour: 144 mg/dL (ref 70–164)
Glucose Tolerance, Fasting: 66 mg/dL (ref 65–99)
Glucose, GTT - 3 Hour: 120 mg/dL (ref 70–144)

## 2015-06-29 LAB — CULTURE, BETA STREP (GROUP B ONLY)

## 2015-07-01 ENCOUNTER — Ambulatory Visit (INDEPENDENT_AMBULATORY_CARE_PROVIDER_SITE_OTHER): Payer: Medicaid Other | Admitting: *Deleted

## 2015-07-01 VITALS — BP 93/59 | HR 99

## 2015-07-01 DIAGNOSIS — O403XX1 Polyhydramnios, third trimester, fetus 1: Secondary | ICD-10-CM | POA: Diagnosis not present

## 2015-07-01 DIAGNOSIS — O99810 Abnormal glucose complicating pregnancy: Secondary | ICD-10-CM

## 2015-07-01 NOTE — Progress Notes (Signed)
Pt felt good FM during NST today. Consult with Dr. Debroah Loop for plan of care.  3hr GTT this week was normal, therefore pt does not have gestational diabetes. Pt advised that she may stop testing blood sugar. She may continue to follow the diabetic diet if desired as it is nutritionally balanced.  AFI repeated today due to mild elevation on 8/9 and need for management decision if polyhydramnios exists.  AFI = 11.2cm - no polyhydramnios.  Pt to return in 1 week for prenatal visit and postdates fetal testing. Labor sx reviewed and pt advised to return to hospital for sx of labor or decreased FM. Pt voiced understanding of all information and instructions given.

## 2015-07-08 ENCOUNTER — Telehealth (HOSPITAL_COMMUNITY): Payer: Self-pay | Admitting: *Deleted

## 2015-07-08 ENCOUNTER — Encounter: Payer: Self-pay | Admitting: Obstetrics and Gynecology

## 2015-07-08 ENCOUNTER — Ambulatory Visit (INDEPENDENT_AMBULATORY_CARE_PROVIDER_SITE_OTHER): Payer: Medicaid Other | Admitting: Obstetrics and Gynecology

## 2015-07-08 ENCOUNTER — Encounter (HOSPITAL_COMMUNITY): Payer: Self-pay | Admitting: *Deleted

## 2015-07-08 VITALS — BP 100/59 | HR 100 | Wt 150.7 lb

## 2015-07-08 DIAGNOSIS — O368990 Maternal care for other specified fetal problems, unspecified trimester, not applicable or unspecified: Secondary | ICD-10-CM

## 2015-07-08 DIAGNOSIS — O480 Post-term pregnancy: Secondary | ICD-10-CM

## 2015-07-08 DIAGNOSIS — O0933 Supervision of pregnancy with insufficient antenatal care, third trimester: Secondary | ICD-10-CM

## 2015-07-08 DIAGNOSIS — IMO0002 Reserved for concepts with insufficient information to code with codable children: Secondary | ICD-10-CM

## 2015-07-08 DIAGNOSIS — O0990 Supervision of high risk pregnancy, unspecified, unspecified trimester: Secondary | ICD-10-CM | POA: Diagnosis not present

## 2015-07-08 LAB — POCT URINALYSIS DIP (DEVICE)
BILIRUBIN URINE: NEGATIVE
Glucose, UA: NEGATIVE mg/dL
HGB URINE DIPSTICK: NEGATIVE
Ketones, ur: NEGATIVE mg/dL
Nitrite: NEGATIVE
Protein, ur: NEGATIVE mg/dL
Specific Gravity, Urine: 1.02 (ref 1.005–1.030)
Urobilinogen, UA: 0.2 mg/dL (ref 0.0–1.0)
pH: 7 (ref 5.0–8.0)

## 2015-07-08 NOTE — Telephone Encounter (Signed)
Preadmission screen  

## 2015-07-08 NOTE — Progress Notes (Signed)
Subjective:  Mia James is a 19 y.o. G2P1001 at [redacted]w[redacted]d being seen today for ongoing prenatal care.  Patient reports no complaints.   .   .  . Denies leaking of fluid.   The following portions of the patient's history were reviewed and updated as appropriate: allergies, current medications, past family history, past medical history, past social history, past surgical history and problem list.   Objective:  There were no vitals filed for this visit.  Fetal Status:           General:  Alert, oriented and cooperative. Patient is in no acute distress.  Skin: Skin is warm and dry. No rash noted.   Cardiovascular: Normal heart rate noted  Respiratory: Normal respiratory effort, no problems with respiration noted  Abdomen: Soft, gravid, appropriate for gestational age.       Pelvic:       Cervical exam deferred        Extremities: Normal range of motion.     Mental Status: Normal mood and affect. Normal behavior. Normal judgment and thought content.   Urinalysis:      Assessment and Plan:  Pregnancy: G2P1001 at [redacted]w[redacted]d  1. Limited prenatal care, third trimester Patient is doing well without complaints Postdate testing today Will plan for IOL on Monday 07/11/2015 Questions related to the induction process were answered NST reviewed and reactive  2. High risk pregnancy, antepartum   3. Fetal pericardial effusion resolved  Term labor symptoms and general obstetric precautions including but not limited to vaginal bleeding, contractions, leaking of fluid and fetal movement were reviewed in detail with the patient. Please refer to After Visit Summary for other counseling recommendations.  No Follow-up on file.   Catalina Antigua, MD

## 2015-07-11 ENCOUNTER — Inpatient Hospital Stay (HOSPITAL_COMMUNITY)
Admission: RE | Admit: 2015-07-11 | Discharge: 2015-07-14 | DRG: 775 | Disposition: A | Discharge status: Home / Self Care | Payer: Medicaid Other | Source: Ambulatory Visit | Attending: Family Medicine | Admitting: Family Medicine

## 2015-07-11 ENCOUNTER — Encounter (HOSPITAL_COMMUNITY): Payer: Self-pay

## 2015-07-11 VITALS — BP 115/76 | HR 92 | Temp 98.0°F | Resp 18 | Ht 64.0 in | Wt 150.0 lb

## 2015-07-11 DIAGNOSIS — Z833 Family history of diabetes mellitus: Secondary | ICD-10-CM | POA: Diagnosis not present

## 2015-07-11 DIAGNOSIS — Z8249 Family history of ischemic heart disease and other diseases of the circulatory system: Secondary | ICD-10-CM

## 2015-07-11 DIAGNOSIS — L309 Dermatitis, unspecified: Secondary | ICD-10-CM | POA: Diagnosis present

## 2015-07-11 DIAGNOSIS — O99810 Abnormal glucose complicating pregnancy: Secondary | ICD-10-CM | POA: Diagnosis present

## 2015-07-11 DIAGNOSIS — O0990 Supervision of high risk pregnancy, unspecified, unspecified trimester: Secondary | ICD-10-CM

## 2015-07-11 DIAGNOSIS — Z3A41 41 weeks gestation of pregnancy: Secondary | ICD-10-CM | POA: Diagnosis present

## 2015-07-11 DIAGNOSIS — O9972 Diseases of the skin and subcutaneous tissue complicating childbirth: Secondary | ICD-10-CM | POA: Diagnosis present

## 2015-07-11 DIAGNOSIS — O480 Post-term pregnancy: Principal | ICD-10-CM | POA: Diagnosis present

## 2015-07-11 DIAGNOSIS — N390 Urinary tract infection, site not specified: Secondary | ICD-10-CM | POA: Diagnosis present

## 2015-07-11 DIAGNOSIS — O9902 Anemia complicating childbirth: Secondary | ICD-10-CM | POA: Diagnosis present

## 2015-07-11 DIAGNOSIS — O0930 Supervision of pregnancy with insufficient antenatal care, unspecified trimester: Secondary | ICD-10-CM

## 2015-07-11 DIAGNOSIS — IMO0002 Reserved for concepts with insufficient information to code with codable children: Secondary | ICD-10-CM

## 2015-07-11 LAB — TYPE AND SCREEN
ABO/RH(D): O POS
ANTIBODY SCREEN: NEGATIVE

## 2015-07-11 LAB — CBC
HCT: 30.4 % — ABNORMAL LOW (ref 36.0–46.0)
HEMOGLOBIN: 9.7 g/dL — AB (ref 12.0–15.0)
MCH: 24.7 pg — AB (ref 26.0–34.0)
MCHC: 31.9 g/dL (ref 30.0–36.0)
MCV: 77.4 fL — ABNORMAL LOW (ref 78.0–100.0)
PLATELETS: 208 10*3/uL (ref 150–400)
RBC: 3.93 MIL/uL (ref 3.87–5.11)
RDW: 15.8 % — ABNORMAL HIGH (ref 11.5–15.5)
WBC: 8.7 10*3/uL (ref 4.0–10.5)

## 2015-07-11 LAB — RPR: RPR: NONREACTIVE

## 2015-07-11 LAB — HIV ANTIBODY (ROUTINE TESTING W REFLEX): HIV SCREEN 4TH GENERATION: NONREACTIVE

## 2015-07-11 MED ORDER — DIPHENHYDRAMINE HCL 50 MG/ML IJ SOLN: 12.5000 mg | INTRAMUSCULAR | Status: DC | PRN

## 2015-07-11 MED ORDER — OXYTOCIN BOLUS FROM INFUSION
500.0000 mL | INTRAVENOUS | Status: DC
  Administered 2015-07-12: 500 mL via INTRAVENOUS

## 2015-07-11 MED ORDER — TERBUTALINE SULFATE 1 MG/ML IJ SOLN
0.2500 mg | Freq: Once | INTRAMUSCULAR | Status: DC | PRN
  Filled 2015-07-11: qty 1

## 2015-07-11 MED ORDER — FENTANYL 2.5 MCG/ML BUPIVACAINE 1/10 % EPIDURAL INFUSION (WH - ANES)
14.0000 mL/h | INTRAMUSCULAR | Status: DC | PRN
  Filled 2015-07-11: qty 125

## 2015-07-11 MED ORDER — ACETAMINOPHEN 325 MG PO TABS: 650.0000 mg | ORAL_TABLET | ORAL | Status: DC | PRN

## 2015-07-11 MED ORDER — OXYCODONE-ACETAMINOPHEN 5-325 MG PO TABS
1.0000 | ORAL_TABLET | ORAL | Status: DC | PRN
  Administered 2015-07-12: 1 via ORAL
  Filled 2015-07-11: qty 1

## 2015-07-11 MED ORDER — LACTATED RINGERS IV SOLN
INTRAVENOUS | Status: DC
  Administered 2015-07-11: 1000 mL via INTRAVENOUS

## 2015-07-11 MED ORDER — EPHEDRINE 5 MG/ML INJ
10.0000 mg | INTRAVENOUS | Status: DC | PRN
  Filled 2015-07-11: qty 2

## 2015-07-11 MED ORDER — OXYCODONE-ACETAMINOPHEN 5-325 MG PO TABS: 2.0000 | ORAL_TABLET | ORAL | Status: DC | PRN

## 2015-07-11 MED ORDER — OXYTOCIN 40 UNITS IN LACTATED RINGERS INFUSION - SIMPLE MED
1.0000 m[IU]/min | INTRAVENOUS | Status: DC
  Administered 2015-07-11: 2 m[IU]/min via INTRAVENOUS

## 2015-07-11 MED ORDER — PHENYLEPHRINE 40 MCG/ML (10ML) SYRINGE FOR IV PUSH (FOR BLOOD PRESSURE SUPPORT)
80.0000 ug | PREFILLED_SYRINGE | INTRAVENOUS | Status: DC | PRN
  Filled 2015-07-11: qty 20
  Filled 2015-07-11: qty 2

## 2015-07-11 MED ORDER — ONDANSETRON HCL 4 MG/2ML IJ SOLN: 4.0000 mg | Freq: Four times a day (QID) | INTRAMUSCULAR | Status: DC | PRN

## 2015-07-11 MED ORDER — OXYTOCIN 40 UNITS IN LACTATED RINGERS INFUSION - SIMPLE MED
62.5000 mL/h | INTRAVENOUS | Status: DC
  Filled 2015-07-11 (×2): qty 1000

## 2015-07-11 MED ORDER — LIDOCAINE HCL (PF) 1 % IJ SOLN
30.0000 mL | INTRAMUSCULAR | Status: DC | PRN
  Filled 2015-07-11: qty 30

## 2015-07-11 MED ORDER — LACTATED RINGERS IV SOLN: 500.0000 mL | INTRAVENOUS | Status: DC | PRN

## 2015-07-11 MED ORDER — MISOPROSTOL 25 MCG QUARTER TABLET
25.0000 ug | ORAL_TABLET | ORAL | Status: DC | PRN
  Filled 2015-07-11: qty 1

## 2015-07-11 MED ORDER — CITRIC ACID-SODIUM CITRATE 334-500 MG/5ML PO SOLN: 30.0000 mL | ORAL | Status: DC | PRN

## 2015-07-11 NOTE — H&P (Signed)
OBSTETRIC ADMISSION HISTORY AND PHYSICAL  Mia James is a 19 y.o. female G2P1001 with IUP at [redacted]w[redacted]d by 1st trim [place] and 25wk presenting for PDIOL. She reports +FMs, No LOF, no VB, no blurry vision, headaches or peripheral edema, and RUQ pain.  She plans on breast feeding. She request Unsure regarding birth control.  Patient had polyhydramnios but this has resolved as of 8/12 per in clinic AFI. There was also concern for GDm given abnormal 1 hr but she had her 3 hours though this was in the 3rd trimester. 3hr GTT was negative. She was compliant with gdm diet.   Dating: By 1st trimester [place] per pt and 25 wk [place] --->  Estimated Date of Delivery: 07/04/15  Sono:    Clinic [facility]  >>>>   High Risk  Prenatal Labs  Dating 1st trimester U/S per pt c/w 25 week ultrasound Blood type: O/POS/-- (04/27 1319) O pos  Genetic Screen Late to care    NIPS: Antibody:NEG (04/27 1319)negative  Anatomic [place] Normal  >>  34 week US showed Polyhydramnios (29.9) and small pericardial effusion (4mm) >> effusion resolved on f/u Rubella: 2.01 (04/27 1319)Immune  GTT  Third trimester: 157   3 hr: 66-126-144-120 RPR: NON REAC (04/27 1319) NR  Flu vaccine  declined HBsAg: NEGATIVE (04/27 1319) neg  TDaP vaccine      04/06/15                                         Rhogam: n/a HIV: NONREACTIVE (04/27 1319) NR  GBS  NEG GBS: NEG  Contraception  Pap:n/a  Baby Food breast   Circumcision  if boy, outpatient circ desired   Pediatrician    Support Person father    Prenatal History/Complications:  Past Medical History: Past Medical History  Diagnosis Date  . Anemia   . Infection     UTI  . Eczema     Past Surgical History: Past Surgical History  Procedure Laterality Date  . No past surgeries      Obstetrical History: OB History    Gravida Para Term Preterm AB TAB SAB Ectopic Multiple Living   0 0 0 0 0 0 1      Social History: Social History   Social History  . Marital Status: Single    Spouse Name:  N/A  . Number of Children: N/A  . Years of Education: N/A   Social History Main Topics  . Smoking status: Never Smoker   . Smokeless tobacco: Never Used  . Alcohol Use: No  . Drug Use: No  . Sexual Activity: Yes    Birth Control/ Protection: None   Other Topics Concern  . None   Social History Narrative    Family History: Family History  Problem Relation Age of Onset  . Heart disease Maternal Grandmother   . Diabetes Maternal Grandfather     Allergies: No Known Allergies  Prescriptions prior to admission  Medication Sig Dispense Refill Last Dose  . ferrous sulfate 325 (65 FE) MG tablet Take 325 mg by mouth daily.    07/10/2015 at Unknown time  . Prenatal Vit-Fe Fumarate-FA (PRENATAL VITAMINS) 28-0.8 MG TABS Take 1 tablet by mouth daily.   07/10/2015 at Unknown time  . ACCU-CHEK FASTCLIX LANCETS MISC Inject 1 each into the skin 4 (four) times daily. DX GDM O24.419 for testing 4 times  daily 102 each 12 Taking  . glucose blood (ACCU-CHEK AVIVA) test strip Accu-chek Aviva Plus test strips DX GDM   O24.419  For testing 4 times daily 100 each 12 Taking     Review of Systems   All systems reviewed and negative except as stated in HPI  Blood pressure 113/66, pulse 92, temperature 97.8 F (36.6 C), temperature source Oral, resp. rate 18, height 5\' 4"  (1.626 m), weight 150 lb (68.04 kg). General appearance: alert and cooperative Lungs: clear to auscultation bilaterally Heart: regular rate and rhythm Abdomen: soft, non-tender; bowel sounds normal Pelvic: adequate Extremities: Homans sign is negative, no sign of DVT  Presentation: cephalic Fetal monitoringBaseline: 140 bpm, Variability: Fair (1-6 bpm), Accelerations: Absent and Decelerations: Absent Uterine activity: Occasional  Dilation: 2 Effacement (%): 60 Station: -3 Exam by:: dr. Doroteo Glassman  Prenatal labs: ABO, Rh: O/POS/-- (04/27 1319) Antibody: NEG (04/27 1319) Rubella:  Immune RPR: NON REAC (05/18 1236)  HBsAg:  NEGATIVE (04/27 1319)  HIV: NONREACTIVE (05/18 1236)  GBS: Negative (08/08 0000)  1 hr Glucola 152  Genetic screening not performed Anatomy US showed polyhydramnios and fetal cardiac  Prenatal Transfer Tool  Maternal Diabetes: No- 1hr GTT was 152 but 3hr was negative Genetic Screening: Declined  Maternal Ultrasounds/Referrals: Abnormal:  Findings:   Other: Polyhydramnios and fetal cardiac effusion that later resolved Fetal Ultrasounds or other Referrals:  None Maternal Substance Abuse:  No Significant Maternal Medications:  None Significant Maternal Lab Results: None  Results for orders placed or performed during the hospital encounter of 07/11/15 (from the past 24 hour(s))  CBC   Collection Time: 07/11/15  7:55 AM  Result Value Ref Range   WBC 8.7 4.0 - 10.5 K/uL   RBC 3.93 3.87 - 5.11 MIL/uL   Hemoglobin 9.7 (L) 12.0 - 15.0 g/dL   HCT 16.1 (L) 09.6 - 04.5 %   MCV 77.4 (L) 78.0 - 100.0 fL   MCH 24.7 (L) 26.0 - 34.0 pg   MCHC 31.9 30.0 - 36.0 g/dL   RDW 40.9 (H) 81.1 - 91.4 %   Platelets 208 150 - 400 K/uL    Patient Active Problem List   Diagnosis Date Noted  . Post term pregnancy 07/11/2015  . Limited prenatal care 06/27/2015  . 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 Abnormal O'Sullivan glucose challenge test, antepartum   . High risk pregnancy, antepartum 03/16/2015    Assessment: Mia James is a 19 y.o. G2P1001 at [redacted]w[redacted]d here for PD-IOL  #Labor: IOL with cytotec. Patient does not want pitocin at this time.  #Pain: PO and IV pain medications prn; dopes not want epidural #FWB:  Category 2 #ID: GBS neg #MOF: Breast #MOC: undecided #Circ:  Declined  Caryl Ada, DO 07/11/2015, 8:45 AM PGY-2, [facility]     OB fellow attestation: I have seen and examined this patient; I agree with above documentation in the resident's note.   Mia James is a 19 y.o. G2P1001 here for IOL for postdates  PE: BP 102/63 mmHg  Pulse 100   Temp(Src) 97.8 F (36.6 C) (Oral)  Resp 18  Ht 5\' 4"  (1.626 m)  Wt 150 lb (68.04 kg)  BMI 25.73 kg/m2 Gen: calm comfortable, NAD Resp: normal effort, no distress Abd: gravid  ROS, labs, PMH reviewed  Plan: MOF: Breast MOC: Desires Nexplanon but still a little unsure. Reviewed grant ID: GBS [id number] FWB: Cat I, not reactive but reassuring Labor: Induction, plan for FB placement Circ: declined Pain: unsure  at this point.   Federico Flake, MD Family Medicine, OB Fellow 07/11/2015, 1:16 PM

## 2015-07-11 NOTE — Progress Notes (Signed)
Patient continues to state she would not like an epidural.  Open to pain medication, but understands risks to baby due to minimal variability in FHR throughout the day and is ok to continue without pain medication.  FOB at bedside coaching patient and assisting in positioning.  Wolfgang Phoenix RN

## 2015-07-12 ENCOUNTER — Encounter (HOSPITAL_COMMUNITY): Payer: Self-pay

## 2015-07-12 ENCOUNTER — Encounter (HOSPITAL_COMMUNITY): Payer: Self-pay | Admitting: Anesthesiology

## 2015-07-12 DIAGNOSIS — Z3A41 41 weeks gestation of pregnancy: Secondary | ICD-10-CM

## 2015-07-12 MED ORDER — DIPHENHYDRAMINE HCL 50 MG/ML IJ SOLN: 12.5000 mg | INTRAMUSCULAR | Status: DC | PRN

## 2015-07-12 MED ORDER — ONDANSETRON HCL 4 MG PO TABS: 4.0000 mg | ORAL_TABLET | ORAL | Status: DC | PRN

## 2015-07-12 MED ORDER — TETANUS-DIPHTH-ACELL PERTUSSIS 5-2.5-18.5 LF-MCG/0.5 IM SUSP: 0.5000 mL | Freq: Once | INTRAMUSCULAR | Status: DC

## 2015-07-12 MED ORDER — LANOLIN HYDROUS EX OINT: TOPICAL_OINTMENT | CUTANEOUS | Status: DC | PRN

## 2015-07-12 MED ORDER — FENTANYL 2.5 MCG/ML BUPIVACAINE 1/10 % EPIDURAL INFUSION (WH - ANES): 14.0000 mL/h | INTRAMUSCULAR | Status: DC | PRN

## 2015-07-12 MED ORDER — PRENATAL MULTIVITAMIN CH
1.0000 | ORAL_TABLET | Freq: Every day | ORAL | Status: DC
  Administered 2015-07-12 – 2015-07-13 (×2): 1 via ORAL
  Filled 2015-07-12 (×2): qty 1

## 2015-07-12 MED ORDER — OXYCODONE-ACETAMINOPHEN 5-325 MG PO TABS: 2.0000 | ORAL_TABLET | ORAL | Status: DC | PRN

## 2015-07-12 MED ORDER — BENZOCAINE-MENTHOL 20-0.5 % EX AERO: 1.0000 "application " | INHALATION_SPRAY | CUTANEOUS | Status: DC | PRN

## 2015-07-12 MED ORDER — SIMETHICONE 80 MG PO CHEW: 80.0000 mg | CHEWABLE_TABLET | ORAL | Status: DC | PRN

## 2015-07-12 MED ORDER — FENTANYL CITRATE (PF) 100 MCG/2ML IJ SOLN
100.0000 ug | INTRAMUSCULAR | Status: DC | PRN
  Administered 2015-07-12: 100 ug via INTRAVENOUS
  Filled 2015-07-12: qty 2

## 2015-07-12 MED ORDER — OXYCODONE-ACETAMINOPHEN 5-325 MG PO TABS: 1.0000 | ORAL_TABLET | ORAL | Status: DC | PRN

## 2015-07-12 MED ORDER — DIPHENHYDRAMINE HCL 25 MG PO CAPS: 25.0000 mg | ORAL_CAPSULE | Freq: Four times a day (QID) | ORAL | Status: DC | PRN

## 2015-07-12 MED ORDER — SENNOSIDES-DOCUSATE SODIUM 8.6-50 MG PO TABS
2.0000 | ORAL_TABLET | ORAL | Status: DC
  Administered 2015-07-13: 2 via ORAL
  Filled 2015-07-12 (×2): qty 2

## 2015-07-12 MED ORDER — ACETAMINOPHEN 325 MG PO TABS: 650.0000 mg | ORAL_TABLET | ORAL | Status: DC | PRN

## 2015-07-12 MED ORDER — PHENYLEPHRINE 40 MCG/ML (10ML) SYRINGE FOR IV PUSH (FOR BLOOD PRESSURE SUPPORT)
80.0000 ug | PREFILLED_SYRINGE | INTRAVENOUS | Status: DC | PRN
  Filled 2015-07-12: qty 2

## 2015-07-12 MED ORDER — IBUPROFEN 600 MG PO TABS
600.0000 mg | ORAL_TABLET | Freq: Four times a day (QID) | ORAL | Status: DC
Start: 2015-07-12 — End: 2015-07-14
  Administered 2015-07-12 – 2015-07-14 (×7): 600 mg via ORAL
  Filled 2015-07-12 (×9): qty 1

## 2015-07-12 MED ORDER — DIBUCAINE 1 % RE OINT: 1.0000 "application " | TOPICAL_OINTMENT | RECTAL | Status: DC | PRN

## 2015-07-12 MED ORDER — ZOLPIDEM TARTRATE 5 MG PO TABS: 5.0000 mg | ORAL_TABLET | Freq: Every evening | ORAL | Status: DC | PRN

## 2015-07-12 MED ORDER — WITCH HAZEL-GLYCERIN EX PADS: 1.0000 "application " | MEDICATED_PAD | CUTANEOUS | Status: DC | PRN

## 2015-07-12 MED ORDER — PHENYLEPHRINE 40 MCG/ML (10ML) SYRINGE FOR IV PUSH (FOR BLOOD PRESSURE SUPPORT): 80.0000 ug | PREFILLED_SYRINGE | INTRAVENOUS | Status: DC | PRN

## 2015-07-12 MED ORDER — EPHEDRINE 5 MG/ML INJ: 10.0000 mg | INTRAVENOUS | Status: DC | PRN

## 2015-07-12 MED ORDER — ONDANSETRON HCL 4 MG/2ML IJ SOLN: 4.0000 mg | INTRAMUSCULAR | Status: DC | PRN

## 2015-07-12 NOTE — Lactation Note (Addendum)
This note was copied from the chart of Mia [name] Spallone. Lactation Consultation Note Mom had BF her 40 month old for 4 months. Baby on triple photo therapy w/+DAT. Baby is BF well and MD order to supplement w/breast milk or formula.  Mom encouraged to feed baby 8-12 times/24 hours and with feeding cues. Hand expression taught to Mom w/colostrum noted. Referred to Baby and Me Book in Breastfeeding section Pg. 22-23 for position options and Proper latch demonstration.Mom encouraged to do skin-to-skin.Mom encouraged to waken baby for feeds. Educated about newborn behavior, jaundice causing baby to be more sleepy and needing more stimulation to wake to BF.WH/LC brochure given w/resources, support groups and LC services. Mom has good everted nipples and baby has a deep latch. Encouraged to use props for support during BF. Patient Name: Mia James Today's Date: 07/12/2015 Reason for consult: Initial assessment   Maternal Data Has patient been taught Hand Expression?: Yes Does the patient have breastfeeding experience prior to this delivery?: Yes  Feeding Feeding Type: Breast Fed Length of feed: 15 min  LATCH Score/Interventions Latch: Grasps breast easily, tongue down, lips flanged, rhythmical sucking.  Audible Swallowing: A few with stimulation Intervention(s): Hand expression;Alternate breast massage  Type of Nipple: Everted at rest and after stimulation  Comfort (Breast/Nipple): Soft / non-tender     Hold (Positioning): Assistance needed to correctly position infant at breast and maintain latch. Intervention(s): Breastfeeding basics reviewed;Support Pillows;Position options;Skin to skin  LATCH Score: 8  Lactation Tools Discussed/Used     Consult Status Consult Status: Follow-up Date: 07/13/15 Follow-up type: In-patient    Charyl Dancer 07/12/2015, 10:05 PM

## 2015-07-12 NOTE — Progress Notes (Signed)
Mia James is a 19 y.o. G2P1001 at [redacted]w[redacted]d by ultrasound admitted for induction of labor due to Post dates..  Subjective: Breathing through contractions. Husband supportive. Declines epidural  Objective: BP 115/78 mmHg  Pulse 74  Temp(Src) 97.8 F (36.6 C) (Oral)  Resp 18  Ht  (1.626 m)  Wt 150 lb (68.04 kg)  BMI 25.73 kg/m2      FHT:  FHR: 135 bpm, variability: moderate,  accelerations:  Abscent,  decelerations:  Present intermittent small variable decels and early decels. UC:   irregular, every 2-4  minutes SVE:   Dilation: 5.5 Effacement (%): 80 Station: -2, -1 Exam by:: Dr. Adrian Blackwater  Labs: Lab Results  Component Value Date   WBC 8.7 07/11/2015   HGB 9.7* 07/11/2015   HCT 30.4* 07/11/2015   MCV 77.4* 07/11/2015   PLT 208 07/11/2015    Assessment / Plan: Induction of labor due to postterm,  progressing well on pitocin  Labor: Progressing normally Preeclampsia:  labs stable Fetal Wellbeing:  Category II Pain Control:  Fentanyl I/D:  n/a Anticipated MOD:  NSVD  Mia James 07/12/2015, 12:43 AM

## 2015-07-12 NOTE — Anesthesia Preprocedure Evaluation (Deleted)
Anesthesia Evaluation  Patient identified by MRN, date of birth, ID band Patient awake    Reviewed: Allergy & Precautions, Patient's Chart, lab work & pertinent test results  Airway Mallampati: II  TM Distance: >3 FB Neck ROM: Full    Dental no notable dental hx. (+) Teeth Intact   Pulmonary neg pulmonary ROS,  breath sounds clear to auscultation  Pulmonary exam normal       Cardiovascular negative cardio ROS Normal cardiovascular examRhythm:Regular Rate:Normal     Neuro/Psych negative neurological ROS  negative psych ROS   GI/Hepatic negative GI ROS, Neg liver ROS,   Endo/Other  negative endocrine ROS  Renal/GU negative Renal ROS  negative genitourinary   Musculoskeletal negative musculoskeletal ROS (+)   Abdominal   Peds  Hematology  (+) anemia ,   Anesthesia Other Findings   Reproductive/Obstetrics (+) Pregnancy                             Anesthesia Physical Anesthesia Plan  ASA: II  Anesthesia Plan: Epidural   Post-op Pain Management:    Induction:   Airway Management Planned: Natural Airway  Additional Equipment:   Intra-op Plan:   Post-operative Plan:   Informed Consent: I have reviewed the patients History and Physical, chart, labs and discussed the procedure including the risks, benefits and alternatives for the proposed anesthesia with the patient or authorized representative who has indicated his/her understanding and acceptance.     Plan Discussed with: Anesthesiologist  Anesthesia Plan Comments: (Patient delivered prior to placement of Epidural.)       Anesthesia Quick Evaluation

## 2015-07-12 NOTE — Progress Notes (Signed)
Patient ID: Mia James, female   DOB: 1996-02-21, 19 y.o.   MRN: [id number] Attempted to place IUPC without success, resistance met  Consulted Dr Nehemiah Settle who was able to get one inserted  Will watch FHR . If develops decels again (variable and early decels temporarily stopped), will start amnioinfusion  Discussed epidural, pt will think about it  FHR stable Dilation: 5.5 Effacement (%): 80 Cervical Position: Posterior Station: -2, -1 Presentation: Vertex Exam by:: Dr. Lawanna Kobus Vitals:   07/11/15 1812 07/11/15 1934 07/11/15 2120 07/11/15 2205  BP: 113/62 114/58 110/72 115/78  Pulse: 88 91 81 74  Temp: 98.1 F (36.7 C) 98.2 F (36.8 C)  97.8 F (36.6 C)  TempSrc: Oral Oral  Oral  Resp: '18 18 18 18  ' Height:      Weight:

## 2015-07-12 NOTE — Progress Notes (Signed)
UR chart review completed.  

## 2015-07-13 NOTE — Progress Notes (Signed)
Post Partum Day 1  Subjective:  Mia James is a 19 y.o. [id number] [redacted]w[redacted]d s/p NSVD.  No acute events overnight.  Pt denies problems with ambulating, voiding or po intake.  She denies nausea or vomiting.  Pain is well controlled.  She has had flatus. She has not had bowel movement.  Lochia Small.  Plan for birth control is Nexplanon.  Method of Feeding: Breast.  Of note, newborn on phototherapy.   Objective: BP 112/80 mmHg  Pulse 80  Temp(Src) 97.7 F (36.5 C) (Oral)  Resp 18  Ht  (1.626 m)  Wt 150 lb (68.04 kg)  BMI 25.73 kg/m2  SpO2 100%  Breastfeeding? Unknown  Physical Exam:  General: alert, cooperative and no distress Lochia:normal flow Chest: normal work of breathing Heart: regular rate Abdomen: soft, nontender Uterine Fundus: firm DVT Evaluation: No evidence of DVT seen on physical exam. Extremities: no edema   Recent Labs  07/11/15 0755  HGB 9.7*  HCT 30.4*    Assessment/Plan:  ASSESSMENT: Mia James is a 19 y.o. [id number] [redacted]w[redacted]d ppd #1 s/p NSVD doing well.   Plan for discharge tomorrow; however pt may want discharge today depending on status of newborn  Breastfeeding Continue routine care Declined inpatient Nexplanon at this time   LOS: 2 days   Caryl Ada, DO 07/13/2015, 7:31 AM PGY-2, [facility]

## 2015-07-14 MED ORDER — IBUPROFEN 600 MG PO TABS: 600.0000 mg | ORAL_TABLET | Freq: Four times a day (QID) | ORAL | Status: DC

## 2015-07-14 MED ORDER — DOCUSATE SODIUM 100 MG PO CAPS: 100.0000 mg | ORAL_CAPSULE | Freq: Two times a day (BID) | ORAL | Status: DC | PRN

## 2015-07-14 NOTE — Discharge Instructions (Signed)

## 2015-07-14 NOTE — Discharge Summary (Signed)
Obstetric Discharge Summary Reason for Admission: induction of labor Prenatal Procedures: NST and ultrasound Intrapartum Procedures: spontaneous vaginal delivery Postpartum Procedures: none Complications-Operative and Postpartum: none  Delivery Note First Stage: Labor onset: 1500 on 07-11-15 Augmentation : pitocin and foley bulb Analgesia /Anesthesia intrapartum: none SROM at 1500  Second Stage: Complete dilation at 0104 Onset of pushing at 0107  Delivery of a viable infant girl at 0112 by CNM in OA to ROA position Loose nuchal cord x 1 and cord around the body x 2 Cord double clamped after cessation of pulsation, cut by FOB Cord blood sample collected  Infant immediately to maternal abdomen; dried and stimulated  Third Stage: Placenta delivered shultz presentation intact with 3 VC @ 0117 Placenta disposition: hospital Uterine tone firm / bleeding mild  Periurethral laceration identified; hemostatic and not repaired Est. Blood Loss (mL): 400  Complications: none   Hospital Course: Active Problems:   High risk pregnancy, antepartum   Abnormal O'Sullivan glucose challenge test, antepartum   Fetal pericardial effusion   Limited prenatal care   Post term pregnancy   Postpartum care following vaginal delivery   Mia James is a 19 y.o. [id number] s/p NSVD.  Patient was admitted for IOL 2/2 post-dates.  She has postpartum course that was uncomplicated including no problems with ambulating, PO intake, urination, pain, or bleeding. The pt feels ready to go home and  will be discharged with outpatient follow-up.   Today: No acute events overnight.  Pt denies problems with ambulating, voiding or po intake.  She denies nausea or vomiting.  Pain is well controlled.  She has had flatus. She has had bowel movement.  Lochia Small.  Plan for birth control is Nexplanon.  Method of Feeding: Breast and Bottle.  Physical Exam:  General: alert, cooperative and no distress Lochia:  appropriate Uterine Fundus: firm DVT Evaluation: No evidence of DVT seen on physical exam.  H/H: Lab Results  Component Value Date/Time   HGB 9.7* 07/11/2015 07:55 AM   HCT 30.4* 07/11/2015 07:55 AM    Discharge Diagnoses: Term Pregnancy-delivered  Discharge Information: Date: 07/14/2015 Activity: pelvic rest Diet: routine  Medications: PNV, Ibuprofen and Colace Breast feeding:  Yes Condition: stable Instructions: refer to handout Discharge to: home     Medication List    TAKE these medications        ACCU-CHEK FASTCLIX LANCETS Misc  Inject 1 each into the skin 4 (four) times daily. DX GDM O24.419 for testing 4 times daily     docusate sodium 100 MG capsule  Commonly known as:  COLACE  Take 1 capsule (100 mg total) by mouth 2 (two) times daily as needed.     ferrous sulfate 325 (65 FE) MG tablet  Take 325 mg by mouth daily.     glucose blood test strip  Commonly known as:  ACCU-CHEK AVIVA  Accu-chek Aviva Plus test strips DX GDM   O24.419  For testing 4 times daily     ibuprofen 600 MG tablet  Commonly known as:  ADVIL,MOTRIN  Take 1 tablet (600 mg total) by mouth every 6 (six) hours.     Prenatal Vitamins 28-0.8 MG Tabs  Take 1 tablet by mouth daily.       Follow-up Information    Schedule an appointment as soon as possible for a visit with [facility].   Specialty:  Obstetrics and Gynecology   Why:  for post-partum follow-up   Contact information:   [address] [place]  [place] [address] [phone]     Caryl Ada, DO 07/14/2015, 8:40 AM PGY-2, [facility]  CNM attestation I have seen and examined this patient and agree with above documentation in the resident's note.   Mia James is a 19 y.o. [id number] s/p SVD.   Pain is well controlled.  Plan for birth control is Nexplanon outpatient.  Method of Feeding: breast  PE:  BP 115/76 mmHg  Pulse 92  Temp(Src) 98 F (36.7 C) (Oral)  Resp 18  Ht 5'  4" (1.626 m)  Wt 68.04 kg (150 lb)  BMI 25.73 kg/m2  SpO2 100%  Breastfeeding? Unknown Fundus firm  No results for input(s): HGB, HCT in the last 72 hours.   Plan: discharge today - postpartum care discussed - f/u clinic in 6 weeks for postpartum visit   Cam Hai, CNM 9:01 AM  07/14/2015

## 2015-07-15 ENCOUNTER — Ambulatory Visit: Payer: Self-pay

## 2015-07-15 NOTE — Lactation Note (Signed)
This note was copied from the chart of Mia [name] Kwan. Lactation Consultation Note Follow up visit at 92 hours of age.  Baby has recently been fed and is asleep in crib with double photo therapy. Mom continues to complain of nipples pain.  Mom has comfort gels on.  Mom has not been using NS today due to not seeing colostrum in NS when baby is latched.  MBU RN gave mom latch score of "9" without NS.  Mom has milk transitioning.  Encouraged mom to attempt with NS overnight to see if that improved pain (better than "8").  Mom to look for milk transfer in NS. Mom will record feedings and output on app so she can keep a more accurate record.  Mom knows to wake baby for feedings if baby is sleepy for >3 hours.  LC to follow prior to discharge.   Patient Name: Mia James [id number] Date: 07/15/2015 Reason for consult: Follow-up assessment;Hyperbilirubinemia;Breast/nipple pain   Maternal Data    Feeding Feeding Type: Breast Fed Length of feed: 20 min  LATCH Score/Interventions                Intervention(s): Breastfeeding basics reviewed     Lactation Tools Discussed/Used     Consult Status Consult Status: Follow-up Date: 07/16/15 Follow-up type: In-patient    Mia James, Arvella Merles 07/15/2015, 9:58 PM

## 2015-07-16 ENCOUNTER — Ambulatory Visit: Payer: Self-pay

## 2015-07-16 NOTE — Lactation Note (Signed)
This note was copied from the chart of Mia James. Lactation Consultation Note  Mia James is not BF well.  She gets very sleepy at the breast and is not transferring.  Mom's milk is coming to volume.  After discussing an SNS with mom one was initiated at the breast but Mia James did not improve.  Finger feeding was initiated.  She started slowly with poor suction but gradually improved and transferred 60 ml.  Plan is to see one more feeding.  Outpatient appointment scheduled.  Patient Name: Mia James [name] Date: 07/16/2015 Reason for consult: Follow-up assessment   Maternal Data    Feeding Feeding Type: Breast Milk Nipple Type: Slow - flow Length of feed: 20 min  LATCH Score/Interventions Latch: Repeated attempts needed to sustain latch, nipple held in mouth throughout feeding, stimulation needed to elicit sucking reflex.  Audible Swallowing: A few with stimulation Intervention(s): Alternate breast massage  Type of Nipple: Everted at rest and after stimulation  Comfort (Breast/Nipple): Soft / non-tender     Hold (Positioning): Assistance needed to correctly position infant at breast and maintain latch.  LATCH Score: 7  Lactation Tools Discussed/Used Tools: Supplemental Nutrition System   Consult Status Consult Status: Follow-up Date: 07/20/15 Follow-up type: Out-patient    Mia James 07/16/2015, 10:30 AM

## 2015-07-20 ENCOUNTER — Ambulatory Visit (HOSPITAL_COMMUNITY)
Admission: RE | Admit: 2015-07-20 | Discharge: 2015-07-20 | Disposition: A | Discharge status: Home / Self Care | Payer: Medicaid Other | Source: Ambulatory Visit | Attending: Family Medicine | Admitting: Family Medicine

## 2015-07-20 NOTE — Lactation Note (Signed)
Lactation Consult  Mother's reason for visit: Feeding assessment Visit Type:  Outpatient Appointment Notes: Mom reports baby is nursing every 1-2 hours. She falls asleep at the breast after few minutes then wants to nurse again within the hour. Mom reports pain with nursing and pain with milk ejection. Baby had prolonged stay in hospital after delivery (3 days) for hyperbilirubinemia.  Consult:  Initial Lactation Consultant:  Alfred Levins  ________________________________________________________________________  Baby's Name: Mia James Date of Birth: 07/12/2015 Pediatrician: Oz[facility] [place] Gender: female Gestational Age: [redacted]w[redacted]d (At Birth) Birth Weight: 7 lb 12 oz (3515 g) Weight at Discharge: Weight: 7 lb 15 oz (3600 g)Date of Discharge: 07/16/2015 [facility] Weights   07/14/15 0021 07/15/15 0901 07/15/15 2330  Weight: 7 lb 11.3 oz (3495 g) 7 lb 15.5 oz (3615 g) 7 lb 15 oz (3600 g)   Last weight taken from location outside of [facility]: 07/20/15 8 lb. 3.0 oz Location:Pediatrician's office Weight today: 8 lb. 2.1 oz/3690 gm       ________________________________________________________________________  Mother's Name: Mia James Type of delivery:  SVB Breastfeeding Experience:  BF 1st child for 4 months, Reports had painful latch and LMS Maternal Medical Conditions:  anemia Maternal Medications:  PNV  ________________________________________________________________________  Breastfeeding History (Post Discharge)  Frequency of breastfeeding:  Every 1-2 hours Duration of feeding:  10 minutes then falls asleep    Pumping  Type of pump:  Medela pump in style Frequency:  When Mom feels engorged Volume:  180 ml  Infant Intake and Output Assessment  Voids:  11 in 24 hrs.  Color:  Clear yellow Stools:  4 in 24 hrs.  Color:   Yellow  ________________________________________________________________________  Maternal Breast Assessment  Breast:  Full Nipple:  Erect Pain level:  10 with initial latch and pumping, decreases down to 8 Pain interventions:  Expressed breast milk  _______________________________________________________________________ Feeding Assessment/Evaluation  Initial feeding assessment:  Infant's oral assessment:  Variance  Baby has thick labial frenulum that extends down to alveolar ridge of gum, however baby does flange lip well with nursing. Baby also has posterior, lingual frenulum that is short, restricting tongue extension. With suck exam, lower gum rubs again LC finger causing compression  Positioning:  Cross cradle  Mom started the feeding in modified cradle/sitting position but baby was not sustaining good depth, changed to cross cradle and baby was able to obtain/sustain more depth and Mom reports much less discomfort PS=8 down to 0 Right breast  LATCH documentation:  Latch:  2 = Grasps breast easily, tongue down, lips flanged, rhythmical sucking.  Audible swallowing:  2 = Spontaneous and intermittent  Type of nipple:  2 = Everted at rest and after stimulation  Comfort (Breast/Nipple):  1 = Filling, red/small blisters or bruises, mild/mod discomfort  Hold (Positioning):  1 = Assistance needed to correctly position infant at breast and maintain latch  LATCH score:  8  Attached assessment:  Deep  Lips flanged:  No.  Lower lip tucks and needs to be adjusted.  Lips untucked:  No.  Suck assessment:  Nutritive  Pre-feed weight:  3690 g  (8 lb. 2.1 oz.) Post-feed weight:  3736 g (8 lb. 3.8 oz.) Amount transferred:  46 ml  Baby did pop off the breast a little choked with the 1st milk ejection on this 1st breast. PS=8 with initial latch decreasing to 0 with baby nursing.   Additional Feeding Assessment -   Infant's oral assessment:  Variance  Positioning:  Psychiatrist  Left  breast  LATCH documentation:  Latch:  2 = Grasps breast easily, tongue down, lips flanged, rhythmical sucking.  Audible swallowing:  2 = Spontaneous and intermittent  Type of nipple:  2 = Everted at rest and after stimulation  Comfort (Breast/Nipple):  1 = Filling, red/small blisters or bruises, mild/mod discomfort  Hold (Positioning):  1 = Assistance needed to correctly position infant at breast and maintain latch  LATCH score:  8  Attached assessment:  Deep  Lips flanged:  No.  Lips untucked:  No.  Suck assessment:  Nutritive   Pre-feed weight:  3736 g  (8 lb. 3.8 oz.) Post-feed weight:  3758 g (8 lb. 4.6 oz.) Amount transferred:  22 ml  Had Mom hand express prior to latching baby with this feeding and baby managed the milk flow better, did not become choked and sustained the latch well. PS=8 with initial latch, improved to 0.  Total amount transferred:  68 ml  Mom reports she has been BF on one breast only each day. The next day she switches to the other breast. Advised Mom she needs to BF off both breasts each feeding when possible. At least switch breast every other feeding. Advised this is to prevent engorgement, clogged milk ducts and this may be contributing to her painful let down and forceful let down when nursing.  Stressed importance to [employer] of BF off both breasts every day. Suggested to BF on 1st breast for 15-20 minutes up to 30 minutes if needed to empty breast well, then switch to 2nd breast. Next feeding she will start on the breast she finished BF from the previous feeding. Baby will get more volume as well and be more satisfied at the breast.  Stressed importance of deep latch to keep baby stimulated at the breast and to maximize milk transfer. Advised to work with the positioning and latch techniques learned today. Advised Mom to pre-pump or hand express before latching baby if baby is having trouble managing the flow with forceful let down. Advised to pre-pump thru 1st  milk ejection to help with this.  Mom to call for questions/concerns or if would like f/u OP appointment. Suggested to come to support group.

## 2015-08-18 ENCOUNTER — Ambulatory Visit: Payer: Medicaid Other | Admitting: Obstetrics & Gynecology

## 2015-08-18 ENCOUNTER — Ambulatory Visit: Payer: Medicaid Other | Admitting: Medical

## 2016-07-13 ENCOUNTER — Inpatient Hospital Stay (HOSPITAL_COMMUNITY): Payer: Medicaid Other

## 2016-07-13 ENCOUNTER — Encounter (HOSPITAL_COMMUNITY): Payer: Self-pay | Admitting: *Deleted

## 2016-07-13 ENCOUNTER — Inpatient Hospital Stay (HOSPITAL_COMMUNITY)
Admission: AD | Admit: 2016-07-13 | Discharge: 2016-07-13 | Disposition: A | Discharge status: Home / Self Care | Payer: Medicaid Other | Source: Ambulatory Visit | Attending: Obstetrics & Gynecology | Admitting: Obstetrics & Gynecology

## 2016-07-13 DIAGNOSIS — R102 Pelvic and perineal pain: Secondary | ICD-10-CM | POA: Insufficient documentation

## 2016-07-13 DIAGNOSIS — R109 Unspecified abdominal pain: Secondary | ICD-10-CM | POA: Diagnosis not present

## 2016-07-13 DIAGNOSIS — O26891 Other specified pregnancy related conditions, first trimester: Secondary | ICD-10-CM | POA: Insufficient documentation

## 2016-07-13 DIAGNOSIS — Z3A01 Less than 8 weeks gestation of pregnancy: Secondary | ICD-10-CM | POA: Insufficient documentation

## 2016-07-13 DIAGNOSIS — O9989 Other specified diseases and conditions complicating pregnancy, childbirth and the puerperium: Secondary | ICD-10-CM

## 2016-07-13 DIAGNOSIS — Z79899 Other long term (current) drug therapy: Secondary | ICD-10-CM | POA: Diagnosis not present

## 2016-07-13 DIAGNOSIS — K5900 Constipation, unspecified: Secondary | ICD-10-CM | POA: Insufficient documentation

## 2016-07-13 DIAGNOSIS — O99612 Diseases of the digestive system complicating pregnancy, second trimester: Secondary | ICD-10-CM | POA: Diagnosis not present

## 2016-07-13 DIAGNOSIS — O26899 Other specified pregnancy related conditions, unspecified trimester: Secondary | ICD-10-CM

## 2016-07-13 LAB — POCT PREGNANCY, URINE: Preg Test, Ur: POSITIVE — AB

## 2016-07-13 LAB — URINALYSIS, ROUTINE W REFLEX MICROSCOPIC
BILIRUBIN URINE: NEGATIVE
GLUCOSE, UA: NEGATIVE mg/dL
HGB URINE DIPSTICK: NEGATIVE
KETONES UR: NEGATIVE mg/dL
Leukocytes, UA: NEGATIVE
Nitrite: NEGATIVE
PH: 6 (ref 5.0–8.0)
Protein, ur: NEGATIVE mg/dL
Specific Gravity, Urine: <1.005 — ABNORMAL LOW (ref 1.005–1.030)

## 2016-07-13 LAB — CBC
HEMATOCRIT: 34.2 % — AB (ref 36.0–46.0)
Hemoglobin: 11.6 g/dL — ABNORMAL LOW (ref 12.0–15.0)
MCH: 25.8 pg — AB (ref 26.0–34.0)
MCHC: 33.9 g/dL (ref 30.0–36.0)
MCV: 76.2 fL — AB (ref 78.0–100.0)
PLATELETS: 284 10*3/uL (ref 150–400)
RBC: 4.49 MIL/uL (ref 3.87–5.11)
RDW: 13.7 % (ref 11.5–15.5)
WBC: 6.8 10*3/uL (ref 4.0–10.5)

## 2016-07-13 LAB — HCG, QUANTITATIVE, PREGNANCY: hCG, Beta Chain, Quant, S: 52982 m[IU]/mL — ABNORMAL HIGH (ref <5)

## 2016-07-13 LAB — WET PREP, GENITAL
Clue Cells Wet Prep HPF POC: NONE SEEN
SPERM: NONE SEEN
Trich, Wet Prep: NONE SEEN
WBC WET PREP: NONE SEEN
YEAST WET PREP: NONE SEEN

## 2016-07-13 NOTE — Discharge Instructions (Signed)
First Trimester of Pregnancy The first trimester of pregnancy is from week 1 until the end of week 12 (months 1 through 3). A week after a sperm fertilizes an egg, the egg will implant on the wall of the uterus. This embryo will begin to develop into a baby. Genes from you and your partner are forming the baby. The female genes determine whether the baby is a boy or a girl. At 6-8 weeks, the eyes and face are formed, and the heartbeat can be seen on ultrasound. At the end of 12 weeks, all the baby's organs are formed.  Now that you are pregnant, you will want to do everything you can to have a healthy baby. Two of the most important things are to get good prenatal care and to follow your health care provider's instructions. Prenatal care is all the medical care you receive before the baby's birth. This care will help prevent, find, and treat any problems during the pregnancy and childbirth. BODY CHANGES Your body goes through many changes during pregnancy. The changes vary from woman to woman.   You may gain or lose a couple of pounds at first.  You may feel sick to your stomach (nauseous) and throw up (vomit). If the vomiting is uncontrollable, call your health care provider.  You may tire easily.  You may develop headaches that can be relieved by medicines approved by your health care provider.  You may urinate more often. Painful urination may mean you have a bladder infection.  You may develop heartburn as a result of your pregnancy.  You may develop constipation because certain hormones are causing the muscles that push waste through your intestines to slow down.  You may develop hemorrhoids or swollen, bulging veins (varicose veins).  Your breasts may begin to grow larger and become tender. Your nipples may stick out more, and the tissue that surrounds them (areola) may become darker.  Your gums may bleed and may be sensitive to brushing and flossing.  Dark spots or blotches (chloasma,  mask of pregnancy) may develop on your face. This will likely fade after the baby is born.  Your menstrual periods will stop.  You may have a loss of appetite.  You may develop cravings for certain kinds of food.  You may have changes in your emotions from day to day, such as being excited to be pregnant or being concerned that something may go wrong with the pregnancy and baby.  You may have more vivid and strange dreams.  You may have changes in your hair. These can include thickening of your hair, rapid growth, and changes in texture. Some women also have hair loss during or after pregnancy, or hair that feels dry or thin. Your hair will most likely return to normal after your baby is born. WHAT TO EXPECT AT YOUR PRENATAL VISITS During a routine prenatal visit:  You will be weighed to make sure you and the baby are growing normally.  Your blood pressure will be taken.  Your abdomen will be measured to track your baby's growth.  The fetal heartbeat will be listened to starting around week 10 or 12 of your pregnancy.  Test results from any previous visits will be discussed. Your health care provider may ask you:  How you are feeling.  If you are feeling the baby move.  If you have had any abnormal symptoms, such as leaking fluid, bleeding, severe headaches, or abdominal cramping.  If you are using any tobacco products,   including cigarettes, chewing tobacco, and electronic cigarettes.  If you have any questions. Other tests that may be performed during your first trimester include:  Blood tests to find your blood type and to check for the presence of any previous infections. They will also be used to check for low iron levels (anemia) and Rh antibodies. Later in the pregnancy, blood tests for diabetes will be done along with other tests if problems develop.  Urine tests to check for infections, diabetes, or protein in the urine.  An ultrasound to confirm the proper growth  and development of the baby.  An amniocentesis to check for possible genetic problems.  Fetal screens for spina bifida and Down syndrome.  You may need other tests to make sure you and the baby are doing well.  HIV (human immunodeficiency virus) testing. Routine prenatal testing includes screening for HIV, unless you choose not to have this test. HOME CARE INSTRUCTIONS  Medicines  Follow your health care provider's instructions regarding medicine use. Specific medicines may be either safe or unsafe to take during pregnancy.  Take your prenatal vitamins as directed.  If you develop constipation, try taking a stool softener if your health care provider approves. Diet  Eat regular, well-balanced meals. Choose a variety of foods, such as meat or vegetable-based protein, fish, milk and low-fat dairy products, vegetables, fruits, and whole grain breads and cereals. Your health care provider will help you determine the amount of weight gain that is right for you.  Avoid raw meat and uncooked cheese. These carry germs that can cause birth defects in the baby.  Eating four or five small meals rather than three large meals a day may help relieve nausea and vomiting. If you start to feel nauseous, eating a few soda crackers can be helpful. Drinking liquids between meals instead of during meals also seems to help nausea and vomiting.  If you develop constipation, eat more high-fiber foods, such as fresh vegetables or fruit and whole grains. Drink enough fluids to keep your urine clear or pale yellow. Activity and Exercise  Exercise only as directed by your health care provider. Exercising will help you:  Control your weight.  Stay in shape.  Be prepared for labor and delivery.  Experiencing pain or cramping in the lower abdomen or low back is a good sign that you should stop exercising. Check with your health care provider before continuing normal exercises.  Try to avoid standing for long  periods of time. Move your legs often if you must stand in one place for a long time.  Avoid heavy lifting.  Wear low-heeled shoes, and practice good posture.  You may continue to have sex unless your health care provider directs you otherwise. Relief of Pain or Discomfort  Wear a good support bra for breast tenderness.   Take warm sitz baths to soothe any pain or discomfort caused by hemorrhoids. Use hemorrhoid cream if your health care provider approves.   Rest with your legs elevated if you have leg cramps or low back pain.  If you develop varicose veins in your legs, wear support hose. Elevate your feet for 15 minutes, 3-4 times a day. Limit salt in your diet. Prenatal Care  Schedule your prenatal visits by the twelfth week of pregnancy. They are usually scheduled monthly at first, then more often in the last 2 months before delivery.  Write down your questions. Take them to your prenatal visits.  Keep all your prenatal visits as directed by your   health care provider. Safety  Wear your seat belt at all times when driving.  Make a list of emergency phone numbers, including numbers for family, friends, the hospital, and police and fire departments. General Tips  Ask your health care provider for a referral to a local prenatal education class. Begin classes no later than at the beginning of month 6 of your pregnancy.  Ask for help if you have counseling or nutritional needs during pregnancy. Your health care provider can offer advice or refer you to specialists for help with various needs.  Do not use hot tubs, steam rooms, or saunas.  Do not douche or use tampons or scented sanitary pads.  Do not cross your legs for long periods of time.  Avoid cat litter boxes and soil used by cats. These carry germs that can cause birth defects in the baby and possibly loss of the fetus by miscarriage or stillbirth.  Avoid all smoking, herbs, alcohol, and medicines not prescribed by  your health care provider. Chemicals in these affect the formation and growth of the baby.  Do not use any tobacco products, including cigarettes, chewing tobacco, and electronic cigarettes. If you need help quitting, ask your health care provider. You may receive counseling support and other resources to help you quit.  Schedule a dentist appointment. At home, brush your teeth with a soft toothbrush and be gentle when you floss. SEEK MEDICAL CARE IF:   You have dizziness.  You have mild pelvic cramps, pelvic pressure, or nagging pain in the abdominal area.  You have persistent nausea, vomiting, or diarrhea.  You have a bad smelling vaginal discharge.  You have pain with urination.  You notice increased swelling in your face, hands, legs, or ankles. SEEK IMMEDIATE MEDICAL CARE IF:   You have a fever.  You are leaking fluid from your vagina.  You have spotting or bleeding from your vagina.  You have severe abdominal cramping or pain.  You have rapid weight gain or loss.  You vomit blood or material that looks like coffee grounds.  You are exposed to German measles and have never had them.  You are exposed to fifth disease or chickenpox.  You develop a severe headache.  You have shortness of breath.  You have any kind of trauma, such as from a fall or a car accident.   This information is not intended to replace advice given to you by your health care provider. Make sure you discuss any questions you have with your health care provider.   Document Released: 10/30/2001 Document Revised: 11/26/2014 Document Reviewed: 09/15/2013 Elsevier Interactive Patient Education 2016 [employer]  

## 2016-07-13 NOTE — MAU Note (Signed)
Had several + HPT

## 2016-07-13 NOTE — MAU Note (Signed)
Having sharp pain in lower abd for over a week, has gotten progressively more painful. No diarrhea, has been constipated- has been over a wk.

## 2016-07-13 NOTE — MAU Provider Note (Signed)
Chief Complaint: Abdominal Pain   None       SUBJECTIVE HPI: Mia ByarsBrittany N Name is a 20 y.o. G3P2002 at [redacted]w[redacted]d by LMP who presents to maternity admissions reporting lower abdominal pains which are sharp.  They come and go.  + constipation. She denies vaginal bleeding, vaginal itching/burning, urinary symptoms, h/a, dizziness, n/v, or fever/chills.   Just had a baby a year ago  Abdominal Pain  This is a new problem. The current episode started in the past 7 days. The onset quality is gradual. The problem occurs intermittently. The problem has been waxing and waning. The pain is located in the LLQ, RLQ and suprapubic region. The pain is moderate. The quality of the pain is colicky and cramping. The abdominal pain does not radiate. Associated symptoms include constipation. Pertinent negatives include no anorexia, diarrhea, dysuria, fever, frequency, headaches, nausea or vomiting. The pain is aggravated by palpation. The pain is relieved by nothing. She has tried nothing for the symptoms.   RN Note: Having sharp pain in lower abd for over a week, has gotten progressively more painful. No diarrhea, has been constipated- has been over a wk   Past Medical History:  Diagnosis Date  . Anemia   . Eczema   . Infection    UTI   Past Surgical History:  Procedure Laterality Date  . NO PAST SURGERIES     Social History   Social History  . Marital status: Married    Spouse name: N/A  . Number of children: N/A  . Years of education: N/A   Occupational History  . Not on file.   Social History Main Topics  . Smoking status: Never Smoker  . Smokeless tobacco: Never Used  . Alcohol use No  . Drug use: No  . Sexual activity: Yes    Birth control/ protection: None   Other Topics Concern  . Not on file   Social History Narrative  . No narrative on file   No current facility-administered medications on file prior to encounter.    Current Outpatient Prescriptions on File Prior to Encounter   Medication Sig Dispense Refill  . Prenatal Vit-Fe Fumarate-FA (PRENATAL VITAMINS) 28-0.8 MG TABS Take 1 tablet by mouth daily.    Marland Kitchen. ACCU-CHEK FASTCLIX LANCETS MISC Inject 1 each into the skin 4 (four) times daily. DX GDM O24.419 for testing 4 times daily 102 each 12  . docusate sodium (COLACE) 100 MG capsule Take 1 capsule (100 mg total) by mouth 2 (two) times daily as needed. (Patient not taking: Reported on 07/13/2016) 30 capsule 0  . glucose blood (ACCU-CHEK AVIVA) test strip Accu-chek Aviva Plus test strips DX GDM   O24.419  For testing 4 times daily 100 each 12  . ibuprofen (ADVIL,MOTRIN) 600 MG tablet Take 1 tablet (600 mg total) by mouth every 6 (six) hours. (Patient not taking: Reported on 07/13/2016) 30 tablet 0   No Known Allergies  I have reviewed patient's Past Medical Hx, Surgical Hx, Family Hx, Social Hx, medications and allergies.   ROS:  Review of Systems  Constitutional: Negative for fever.  Gastrointestinal: Positive for abdominal pain and constipation. Negative for anorexia, diarrhea, nausea and vomiting.  Genitourinary: Negative for dysuria and frequency.  Neurological: Negative for headaches.   Other systems negative  Physical Exam  Patient Vitals for the past 24 hrs:  BP Temp Temp src Pulse Resp Height Weight  07/13/16 1140 103/60 97.9 F (36.6 C) Oral 92 16 5\' 5"  (1.651 m) 55.2 kg (  121 lb 12 oz)    Physical Exam  Constitutional: Well-developed, well-nourished female in no acute distress.  Cardiovascular: normal rate Respiratory: normal effort GI: Abd soft, non-tender. Pos BS x 4 MS: Extremities nontender, no edema, normal ROM Neurologic: Alert and oriented x 4.  GU: Neg CVAT.  PELVIC EXAM: Cervix pink, visually closed, without lesion, scant white creamy discharge, vaginal walls and external genitalia normal Bimanual exam: Cervix 0/long/high, firm, anterior, neg CMT, uterus nontender, nonenlarged, adnexa without tenderness, enlargement, or mass   LAB  RESULTS Results for orders placed or performed during the hospital encounter of 07/13/16 (from the past 24 hour(s))  Urinalysis, Routine w reflex microscopic (not at [facility])     Status: Abnormal   Collection Time: 07/13/16 11:35 AM  Result Value Ref Range   Color, Urine STRAW (A) YELLOW   APPearance HAZY (A) CLEAR   Specific Gravity, Urine <1.005 (L) 1.005 - 1.030   pH 6.0 5.0 - 8.0   Glucose, UA NEGATIVE NEGATIVE mg/dL   Hgb urine dipstick NEGATIVE NEGATIVE   Bilirubin Urine NEGATIVE NEGATIVE   Ketones, ur NEGATIVE NEGATIVE mg/dL   Protein, ur NEGATIVE NEGATIVE mg/dL   Nitrite NEGATIVE NEGATIVE   Leukocytes, UA NEGATIVE NEGATIVE  Pregnancy, urine POC     Status: Abnormal   Collection Time: 07/13/16 11:40 AM  Result Value Ref Range   Preg Test, Ur POSITIVE (A) NEGATIVE  CBC     Status: Abnormal   Collection Time: 07/13/16 12:03 PM  Result Value Ref Range   WBC 6.8 4.0 - 10.5 K/uL   RBC 4.49 3.87 - 5.11 MIL/uL   Hemoglobin 11.6 (L) 12.0 - 15.0 g/dL   HCT 16.0 (L) 10.9 - 32.3 %   MCV 76.2 (L) 78.0 - 100.0 fL   MCH 25.8 (L) 26.0 - 34.0 pg   MCHC 33.9 30.0 - 36.0 g/dL   RDW 55.7 32.2 - 02.5 %   Platelets 284 150 - 400 K/uL  hCG, quantitative, pregnancy     Status: Abnormal   Collection Time: 07/13/16 12:03 PM  Result Value Ref Range   hCG, Beta Chain, Quant, S 52,982 (H) <5 mIU/mL  Wet prep, genital     Status: None   Collection Time: 07/13/16  1:00 PM  Result Value Ref Range   Yeast Wet Prep HPF POC NONE SEEN NONE SEEN   Trich, Wet Prep NONE SEEN NONE SEEN   Clue Cells Wet Prep HPF POC NONE SEEN NONE SEEN   WBC, Wet Prep HPF POC NONE SEEN NONE SEEN   Sperm NONE SEEN        IMAGING US Ob Comp Less 14 Wks  Result Date: 07/13/2016 CLINICAL DATA:  Abdominal pain. EXAM: OBSTETRIC <14 WK [place] AND TRANSVAGINAL OB US TECHNIQUE: Both transabdominal and transvaginal ultrasound examinations were performed for complete evaluation of the gestation as well as the maternal uterus,  adnexal regions, and pelvic cul-de-sac. Transvaginal technique was performed to assess early pregnancy. COMPARISON:  06/01/2015. FINDINGS: Intrauterine gestational sac: Present Yolk sac:  Present Embryo:  Present Cardiac Activity: Present Heart Rate: 114  bpm CRL:  6.0  Mm   6 w   3 d                  [place] EDC: 03/05/2017 Subchorionic hemorrhage:  None visualized. Maternal uterus/adnexae: No focal abnormality identified. Trace free pelvic fluid. IMPRESSION: Single viable intrauterine pregnancy at 6 weeks 3 days. Fetal heart rate of 114 beats per minute. Clinical correlation suggested. Follow-up  exam suggested. Electronically Signed   By: Maisie Fus  Register   On: 07/13/2016 13:49   US Ob Transvaginal  Result Date: 07/13/2016 CLINICAL DATA:  Abdominal pain. EXAM: OBSTETRIC <14 WK [place] AND TRANSVAGINAL OB US TECHNIQUE: Both transabdominal and transvaginal ultrasound examinations were performed for complete evaluation of the gestation as well as the maternal uterus, adnexal regions, and pelvic cul-de-sac. Transvaginal technique was performed to assess early pregnancy. COMPARISON:  06/01/2015. FINDINGS: Intrauterine gestational sac: Present Yolk sac:  Present Embryo:  Present Cardiac Activity: Present Heart Rate: 114  bpm CRL:  6.0  Mm   6 w   3 d                  [place] EDC: 03/05/2017 Subchorionic hemorrhage:  None visualized. Maternal uterus/adnexae: No focal abnormality identified. Trace free pelvic fluid. IMPRESSION: Single viable intrauterine pregnancy at 6 weeks 3 days. Fetal heart rate of 114 beats per minute. Clinical correlation suggested. Follow-up exam suggested. Electronically Signed   By: Maisie Fus  Register   On: 07/13/2016 13:49    MAU Management/MDM: Ordered usual first trimester r/o ectopic labs.   Pelvic exam and cultures done Will check baseline Ultrasound to rule out ectopic.   This bleeding/pain can represent a normal pregnancy with bleeding, spontaneous abortion or even an ectopic which can be  life-threatening.  The process as listed above helps to determine which of these is present.  Findings are consistent with live single intrauterine pregnancy at [redacted]w[redacted]d  ASSESSMENT Pelvic pain Single IUP at [redacted]w[redacted]d, live  PLAN Discharge home Reassured everything is going well.  Cannot explain pain. Could be growing uterus in closely spaced pregnancy  Pt stable at time of discharge. Encouraged to return here or to other Urgent Care/ED if she develops worsening of symptoms, increase in pain, fever, or other concerning symptoms.    Wynelle Bourgeois CNM, MSN Certified Nurse-Midwife 07/13/2016  12:45 PM

## 2016-07-16 LAB — GC/CHLAMYDIA PROBE AMP (~~LOC~~) NOT AT ARMC
CHLAMYDIA, DNA PROBE: NEGATIVE
Neisseria Gonorrhea: NEGATIVE

## 2016-10-10 ENCOUNTER — Inpatient Hospital Stay (HOSPITAL_COMMUNITY)
Admission: AD | Admit: 2016-10-10 | Discharge: 2016-10-10 | Disposition: A | Discharge status: Home / Self Care | Payer: Medicaid Other | Source: Ambulatory Visit | Attending: Family Medicine | Admitting: Family Medicine

## 2016-10-10 ENCOUNTER — Encounter (HOSPITAL_COMMUNITY): Payer: Self-pay

## 2016-10-10 ENCOUNTER — Inpatient Hospital Stay (HOSPITAL_COMMUNITY): Payer: Medicaid Other

## 2016-10-10 DIAGNOSIS — O26892 Other specified pregnancy related conditions, second trimester: Secondary | ICD-10-CM | POA: Insufficient documentation

## 2016-10-10 DIAGNOSIS — O99012 Anemia complicating pregnancy, second trimester: Secondary | ICD-10-CM | POA: Insufficient documentation

## 2016-10-10 DIAGNOSIS — Z3A19 19 weeks gestation of pregnancy: Secondary | ICD-10-CM | POA: Insufficient documentation

## 2016-10-10 DIAGNOSIS — R1012 Left upper quadrant pain: Secondary | ICD-10-CM | POA: Insufficient documentation

## 2016-10-10 DIAGNOSIS — W109XXA Fall (on) (from) unspecified stairs and steps, initial encounter: Secondary | ICD-10-CM | POA: Diagnosis not present

## 2016-10-10 DIAGNOSIS — O9A212 Injury, poisoning and certain other consequences of external causes complicating pregnancy, second trimester: Secondary | ICD-10-CM | POA: Insufficient documentation

## 2016-10-10 DIAGNOSIS — O4692 Antepartum hemorrhage, unspecified, second trimester: Secondary | ICD-10-CM | POA: Diagnosis not present

## 2016-10-10 DIAGNOSIS — O9989 Other specified diseases and conditions complicating pregnancy, childbirth and the puerperium: Secondary | ICD-10-CM

## 2016-10-10 LAB — URINALYSIS, ROUTINE W REFLEX MICROSCOPIC
BILIRUBIN URINE: NEGATIVE
Glucose, UA: NEGATIVE mg/dL
HGB URINE DIPSTICK: NEGATIVE
Ketones, ur: NEGATIVE mg/dL
Leukocytes, UA: NEGATIVE
Nitrite: NEGATIVE
PH: 6.5 (ref 5.0–8.0)
Protein, ur: NEGATIVE mg/dL
SPECIFIC GRAVITY, URINE: 1.01 (ref 1.005–1.030)

## 2016-10-10 NOTE — MAU Note (Signed)
Last night, slipped on the steps, fell on stomach. Later in the evening- started feeling contraction like pain. When urinated this morning it was "like she peed out blood". Continues to see blood when she wipes. Cramping continues.

## 2016-10-10 NOTE — MAU Provider Note (Signed)
History     CSN: [id number]  Arrival date and time: 10/10/16 1154   First Provider Initiated Contact with Patient 10/10/16 1244      Chief Complaint  Patient presents with  . Fall  . Vaginal Bleeding  . Abdominal Cramping   HPI Mia James is a 20 y.o. [id number] at 8453w1d who presents with abdominal cramping & vaginal bleeding s/p fall. Patient states she was walking up the stairs yesterday while carrying groceries and missed a step; fell directly on steps hitting her abdomen. Reports lower abdominal cramping & pressure since fall. Took tylenol with mild relief. Rates pain 6/10. Nothing makes worse.  Reports episode of blood in toilet last night. Some pink spotting after that which resolved today. Denies LOF. No recent intercourse.  Has not started prenatal care yet. Trying to find a practice that does waterbirth & accepts medicaid.   OB History    Gravida Para Term Preterm AB Living   3 2 2  0 0 2   SAB TAB Ectopic Multiple Live Births   0 0 0 0 2      Past Medical History:  Diagnosis Date  . Anemia   . Eczema   . Infection    UTI    Past Surgical History:  Procedure Laterality Date  . NO PAST SURGERIES      Family History  Problem Relation Age of Onset  . Heart disease Maternal Grandmother   . Diabetes Maternal Grandfather     Social History  Substance Use Topics  . Smoking status: Never Smoker  . Smokeless tobacco: Never Used  . Alcohol use No    Allergies: No Known Allergies  Prescriptions Prior to Admission  Medication Sig Dispense Refill Last Dose  . Prenatal MV-Min-FA-Omega-3 (PRENATAL GUMMIES/DHA & FA) 0.4-32.5 MG CHEW Chew 2 each by mouth daily.   10/09/2016 at Unknown time    Review of Systems  Constitutional: Negative.   Gastrointestinal: Positive for abdominal pain. Negative for constipation, diarrhea, nausea and vomiting.  Genitourinary: Negative for dysuria.       + vaginal bleeding   Physical Exam   Blood pressure 102/58, pulse  97, temperature 98.4 F (36.9 C), temperature source Oral, resp. rate 16, height 5' 4.5" (1.638 m), weight 129 lb 3.2 oz (58.6 kg), last menstrual period 05/24/2016, unknown if currently breastfeeding.  Physical Exam  Nursing note and vitals reviewed. Constitutional: She is oriented to person, place, and time. She appears well-developed and well-nourished. No distress.  HENT:  Head: Normocephalic and atraumatic.  Eyes: Conjunctivae are normal. Right eye exhibits no discharge. Left eye exhibits no discharge. No scleral icterus.  Neck: Normal range of motion.  Cardiovascular: Normal rate, regular rhythm and normal heart sounds.   No murmur heard. Respiratory: Effort normal and breath sounds normal. No respiratory distress. She has no wheezes.  GI: Soft. There is no tenderness.  Fundal height at umbilicus  Genitourinary: No bleeding in the vagina. Vaginal discharge (minimal amount of thin tan discharge) found.  Genitourinary Comments: Cervix closed/thick/high  Neurological: She is alert and oriented to person, place, and time.  Skin: Skin is warm and dry. She is not diaphoretic.  Psychiatric: She has a normal mood and affect. Her behavior is normal. Judgment and thought content normal.    MAU Course  Procedures Results for orders placed or performed during the hospital encounter of 10/10/16 (from the past 24 hour(s))  Urinalysis, Routine w reflex microscopic (not [facility])     Status: None  Collection Time: 10/10/16 12:22 PM  Result Value Ref Range   Color, Urine YELLOW YELLOW   APPearance CLEAR CLEAR   Specific Gravity, Urine 1.010 1.005 - 1.030   pH 6.5 5.0 - 8.0   Glucose, UA NEGATIVE NEGATIVE mg/dL   Hgb urine dipstick NEGATIVE NEGATIVE   Bilirubin Urine NEGATIVE NEGATIVE   Ketones, ur NEGATIVE NEGATIVE mg/dL   Protein, ur NEGATIVE NEGATIVE mg/dL   Nitrite NEGATIVE NEGATIVE   Leukocytes, UA NEGATIVE NEGATIVE    MDM FHT 154 by doppler U/a wnl Ultrasound ordered to assess  placenta O positive Ultrasound -no evidence of placental abruption or previa. No cervical insufficiency Assessment and Plan  A: 1. Fall on stairs   2. [redacted] weeks gestation of pregnancy   3. Vaginal bleeding in pregnancy, second trimester    P: Discharge home Take tylenol prn pain Start prenatal care ASAP -- list of providers given Discussed reasons to return to MAU  Judeth HornErin Nussen Pullin 10/10/2016, 1:00 PM

## 2016-10-10 NOTE — MAU Provider Note (Signed)
History     CSN: [id number]  Arrival date and time: 10/10/16 1154   None     Chief Complaint  Patient presents with  . Fall  . Vaginal Bleeding  . Abdominal Cramping   Patient states she fell on her belly around 9pm last night on wooden stairs while going up. She was carrying groceries upstairs. Patient states it was immediately painful, however about an hour or so later she began having painful cramping pain. At its worst the pain was 9/10 and intermittent, patient describes it as feeling like contractions. Patient states she noticed the bleeding when going to urinate, and she noticed she was "peeing blood." Patient states she put a pad on over night and this morning it was not saturated but there was blood on the pad. Pain has decreased with use of Tylenol, no current vaginal bleeding. Pain is currently 6/10 located in her lower back and lower abdomen. Patient describes suprapubic pressure when urinating. Denies dysuria. Decreased fetal movement.      Abdominal Pain  This is a new problem. The current episode started yesterday. The onset quality is sudden. The problem occurs intermittently. The problem has been gradually improving. The pain is located in the LLQ and LUQ. The pain is at a severity of 6/10. The pain is moderate. The abdominal pain radiates to the back. Pertinent negatives include no diarrhea, fever, headaches, nausea or vomiting. The pain is aggravated by urination (lying flat). She has tried acetaminophen for the symptoms. The treatment provided moderate relief. There is no history of abdominal surgery.  Vaginal Bleeding  The patient's primary symptoms include vaginal bleeding. The patient's pertinent negatives include no genital itching, genital odor, genital rash or vaginal discharge. This is a new problem. The current episode started yesterday. The problem occurs rarely. The problem has been gradually improving. The pain is moderate. The problem affects both sides. She  is pregnant. Associated symptoms include abdominal pain. Pertinent negatives include no diarrhea, fever, headaches, nausea or vomiting. The vaginal bleeding is lighter than menses. She has not been passing clots. She has not been passing tissue. She has tried acetaminophen for the symptoms. The treatment provided mild relief. There is no history of an abdominal surgery, an ectopic pregnancy or a gynecological surgery.    OB History    Gravida Para Term Preterm AB Living   3 2 2  0 0 2   SAB TAB Ectopic Multiple Live Births   0 0 0 0 2      Past Medical History:  Diagnosis Date  . Anemia   . Eczema   . Infection    UTI    Past Surgical History:  Procedure Laterality Date  . NO PAST SURGERIES      Family History  Problem Relation Age of Onset  . Heart disease Maternal Grandmother   . Diabetes Maternal Grandfather     Social History  Substance Use Topics  . Smoking status: Never Smoker  . Smokeless tobacco: Never Used  . Alcohol use No    Allergies: No Known Allergies  Prescriptions Prior to Admission  Medication Sig Dispense Refill Last Dose  . Prenatal MV-Min-FA-Omega-3 (PRENATAL GUMMIES/DHA & FA) 0.4-32.5 MG CHEW Chew 2 each by mouth daily.   10/09/2016 at Unknown time    Review of Systems  Constitutional: Negative for fever.  Gastrointestinal: Positive for abdominal pain. Negative for diarrhea, nausea and vomiting.  Genitourinary: Positive for vaginal bleeding. Negative for vaginal discharge.  Neurological: Negative for headaches.  Physical Exam   Blood pressure 102/58, pulse 97, temperature 98.4 F (36.9 C), temperature source Oral, resp. rate 16, height 5' 4.5" (1.638 m), weight 58.6 kg (129 lb 3.2 oz), last menstrual period 05/24/2016, unknown if currently breastfeeding.  Physical Exam No vaginal or cervical bleeding or discharge on speculum exam. Cervical os is closed.   MAU Course  Procedures Results for orders placed or performed during the hospital  encounter of 10/10/16 (from the past 24 hour(s))  Urinalysis, Routine w reflex microscopic (not [facility])     Status: None   Collection Time: 10/10/16 12:22 PM  Result Value Ref Range   Color, Urine YELLOW YELLOW   APPearance CLEAR CLEAR   Specific Gravity, Urine 1.010 1.005 - 1.030   pH 6.5 5.0 - 8.0   Glucose, UA NEGATIVE NEGATIVE mg/dL   Hgb urine dipstick NEGATIVE NEGATIVE   Bilirubin Urine NEGATIVE NEGATIVE   Ketones, ur NEGATIVE NEGATIVE mg/dL   Protein, ur NEGATIVE NEGATIVE mg/dL   Nitrite NEGATIVE NEGATIVE   Leukocytes, UA NEGATIVE NEGATIVE     MDM Ultrasound results show no placenta previa or placental abruption.   Assessment and Plan  1. Vaginal Bleeding in Second Trimester - Patient advised to continue use of Tylenol for pain, no more than 4000mg  daily.  - Patient advised to seek prenatal care ASAP, list of providers that accept medicaid and complete water births provided to patient.  - Patient educated on reasons to return to MAU. - Patient discharged home.   Gala MurdochKimberly Tunisha Ruland 10/10/2016, 3:25 PM

## 2016-10-10 NOTE — Discharge Instructions (Signed)
What Do I Need to Know About Injuries During Pregnancy? Trauma is the most common cause of injury and death in pregnant women. This can also result in significant harm or death of the baby. Your baby is protected in the womb (uterus) by a sac filled with fluid (amniotic sac). Your baby can be harmed if there is direct, high-impact trauma to your abdomen and pelvis. This type of trauma can result in tearing of your uterus, the placenta pulling away from the wall of the uterus (placenta abruption), or the amniotic sac breaking open (rupture of membranes). These injuries can decrease or stop the blood supply to your baby or cause you to go into labor earlier than expected. Minor falls and low-impact automobile accidents do not usually harm your baby, even if they do minimally harm you. WHAT KIND OF INJURIES CAN AFFECT MY PREGNANCY? The most common causes of injury or death to a baby include:  Falls. Falls are more common in the second and third trimester of the pregnancy. Factors that increase your risk of falling include:  Increase in your weight.  The change in your center of gravity.  Tripping over an object that cannot be seen.  Increased looseness (laxity) of your ligaments resulting in less coordinated movements (you may feel clumsy).  Falling during high-risk activities like horseback riding or skiing.  Automobile accidents. It is important to wear your seat belt properly, with the lap belt below your abdomen, and always practice safe driving.  Domestic violence or assault.  Burns (Building services engineer). The most common causes of injury or death to the pregnant woman include:  Injuries that cause severe bleeding, shock, and loss of blood flow to major organs.  Head and neck injuries that result in severe brain or spinal damage.  Chest trauma that can cause direct injury to the heart and lungs or any injury that affects the area enclosed by the ribs. Trauma to this area can result in  cardiorespiratory arrest. WHAT CAN I DO TO PROTECT MYSELF AND MY BABY FROM INJURY WHILE I AM PREGNANT?  Remove slippery rugs and loose objects on the floor that increase your risk of tripping.  Avoid walking on wet or slippery floors.  Wear comfortable shoes that have a good grip on the sole. Do not wear high-heeled shoes.  Always wear your seat belt properly, with the lap belt below your abdomen, and always practice safe driving. Do not ride on a motorcycle while pregnant.  Do not participate in high-impact activities or sports.  Avoid fires, starting fires, lifting heavy pots of boiling or hot liquids, and fixing electrical problems.  Only take over-the-counter or prescription medicines for pain, fever, or discomfort as directed by your health care provider.  Know your blood type and the father's blood type in case you develop vaginal bleeding or experience an injury for which a blood transfusion may be necessary.  Call your local emergency services (911 in the [place] if you are a victim of domestic violence or assault. Spousal abuse can be a significant cause of trauma during pregnancy. For help and support, contact the [employer]. WHEN SHOULD I SEEK IMMEDIATE MEDICAL CARE?   You fall on your abdomen or experience any high-force accident or injury.  You have been assaulted (domestic or otherwise).  You have been in a car accident.  You develop vaginal bleeding.  You develop fluid leaking from the vagina.  You develop uterine contractions (pelvic cramping, pain, or significant low back  pain).  You become weak or faint, or have uncontrolled vomiting after trauma.  You had a serious burn. This includes burns to the face, neck, hands, or genitals, or burns greater than the size of your palm anywhere else.  You develop neck stiffness or pain after a fall or from other trauma.  You develop a headache or vision problems after a fall or from other  trauma.  You do not feel the baby moving or the baby is not moving as much as before a fall or other trauma. This information is not intended to replace advice given to you by your health care provider. Make sure you discuss any questions you have with your health care provider. Document Released: 12/13/2004 Document Revised: 11/26/2014 Document Reviewed: 08/12/2013 Elsevier Interactive Patient Education  2017 [employer]???  You must attend a Doren Custard class at [place]  3rd Wednesday of every month from [employer] by calling [phone] or online at [email or web address].at  Bring [place] the certificate from the class  Waterbirth supplies needed for [facility]/[place]/Health Department patients:  Our practice has a Heritage manager in a Box tub at the hospital that you can borrow  You will need to purchase an accessory kit that has all needed supplies through [facility] [phone]) or online $175.00  Or you can purchase the supplies separately: o Single-use disposable tub liner for Birth Pool in a Box (REGULAR size) o New garden hose labeled "lead-free", suitable for drinking water", o Electric drain pump to remove water (We recommend 792 gallon per hour or greater pump.)  o  "non-toxic" OR "water potable o Garden hose to remove the dirty water o Fish net o Bathing suit top (optional) o Long-handled mirror (optional)  [employer] sells tubs for ~ $120 if you would rather purchase your own tub.  They also sell accessories, liners.    Www.waterbirthsolutions.com for tub purchases and supplies  The [employer] Ladies (www.thelaborladies.com) $275 for tub rental/set-up & take down/kit   [employer] information regarding doulas (labor support) who provide pool rentals:  IdentityList.se.htm   [employer] Ladies  (www.thelaborladies.com)  IdentityList.se.htm   Things that would prevent you from having a waterbirth:  Premature, <37wks  Previous cesarean birth  Presence of thick meconium-stained fluid  Multiple gestation (Twins, triplets, etc.)  Uncontrolled diabetes or gestational diabetes requiring medication  Hypertension  Heavy vaginal bleeding  Non-reassuring fetal heart rate  Active infection (MRSA, etc.)  If your labor has to be induced and induction method requires continuous monitoring of the baby's heart rate  Other risks/issues identified by your obstetrical provider

## 2016-10-19 ENCOUNTER — Emergency Department (HOSPITAL_COMMUNITY)
Admission: EM | Admit: 2016-10-19 | Discharge: 2016-10-19 | Disposition: A | Discharge status: Home / Self Care | Payer: No Typology Code available for payment source | Attending: Emergency Medicine | Admitting: Emergency Medicine

## 2016-10-19 ENCOUNTER — Encounter (HOSPITAL_COMMUNITY): Payer: Self-pay | Admitting: Emergency Medicine

## 2016-10-19 DIAGNOSIS — R197 Diarrhea, unspecified: Secondary | ICD-10-CM | POA: Diagnosis not present

## 2016-10-19 DIAGNOSIS — R112 Nausea with vomiting, unspecified: Secondary | ICD-10-CM | POA: Insufficient documentation

## 2016-10-19 MED ORDER — METOCLOPRAMIDE HCL 10 MG PO TABS: 5.0000 mg | ORAL_TABLET | Freq: Four times a day (QID) | ORAL | 0 refills | Status: DC | PRN

## 2016-10-19 MED ORDER — METOCLOPRAMIDE HCL 10 MG PO TABS
5.0000 mg | ORAL_TABLET | Freq: Once | ORAL | Status: AC
  Administered 2016-10-19: 5 mg via ORAL
  Filled 2016-10-19: qty 1

## 2016-10-19 NOTE — ED Notes (Signed)
Pt not in room to receive d/c instructions.  

## 2016-10-19 NOTE — ED Notes (Signed)
Fetal heart tones checked by EDP

## 2016-10-19 NOTE — ED Notes (Signed)
Mother states that they would like to be discharged.  Pt and her son have tolerated the popsicles

## 2016-10-19 NOTE — ED Triage Notes (Addendum)
Patient states she has lower abdominal pain x 1 day.  She has increased N/V.  She arrived with family members and all 3 are sick.   Patient states she is [redacted] weeks pregnant

## 2016-10-19 NOTE — ED Notes (Signed)
Pt reports that she can feels her baby move.  No contractions

## 2016-10-19 NOTE — ED Notes (Signed)
Gave pt a popsicle, will give her additional po once she has had nausea med and tolerated this

## 2016-10-19 NOTE — ED Notes (Signed)
Pt denies any contractions, no fluid from her vagina.

## 2016-10-19 NOTE — ED Provider Notes (Signed)
[facility]-EMERGENCY DEPT Provider Note   CSN: [id number] Arrival date & time: 10/19/16  1350     History   Chief Complaint Chief Complaint  Patient presents with  . Nausea  . Abdominal Pain    HPI Mia James is a 20 y.o. female.  The history is provided by the patient.   Patient presents today with 1 day of nausea vomiting and diarrhea. Patient's husband and son have similar symptoms. She reports that they ate cookout last night however the son did not have any of the food. She did report her 20-year-old daughter did have emesis earlier last week. Denies any recent travels. Endorses abdominal discomfort just prior to having a bowel movement however denies any current abdominal pain. She is [redacted] weeks pregnant. Denies any vaginal bleeding or discharge.  Emesis and diarrhea are nonbloody.  Past Medical History:  Diagnosis Date  . Anemia   . Eczema   . Infection    UTI    Patient Active Problem List   Diagnosis Date Noted  . Postpartum care following vaginal delivery 07/12/2015  . Post term pregnancy 07/11/2015  . Limited prenatal care 06/27/2015  . 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 Abnormal O'Sullivan glucose challenge test, antepartum   . High risk pregnancy, antepartum 03/16/2015    Past Surgical History:  Procedure Laterality Date  . NO PAST SURGERIES      OB History    Gravida Para Term Preterm AB Living   3 2 2  0 0 2   SAB TAB Ectopic Multiple Live Births   0 0 0 0 2       Home Medications    Prior to Admission medications   Medication Sig Start Date End Date Taking? Authorizing Provider  Prenatal MV-Min-FA-Omega-3 (PRENATAL GUMMIES/DHA & FA) 0.4-32.5 MG CHEW Chew 2 each by mouth daily.   Yes Historical Provider, MD  metoCLOPramide (REGLAN) 10 MG tablet Take 0.5 tablets (5 mg total) by mouth every 6 (six) hours as needed for nausea. 10/19/16 10/22/16  Nira ConnPedro Eduardo Ivon Oelkers, MD    Family History Family History  Problem Relation Age of Onset    . Heart disease Maternal Grandmother   . Diabetes Maternal Grandfather     Social History Social History  Substance Use Topics  . Smoking status: Never Smoker  . Smokeless tobacco: Never Used  . Alcohol use No     Allergies   Patient has no known allergies.   Review of Systems Review of Systems  Constitutional: Negative for chills and fever.  HENT: Negative for ear pain and sore throat.   Eyes: Negative for pain and visual disturbance.  Respiratory: Negative for cough and shortness of breath.   Cardiovascular: Negative for chest pain and palpitations.  Gastrointestinal: Positive for diarrhea, nausea and vomiting. Negative for abdominal pain and blood in stool.  Genitourinary: Negative for dysuria and hematuria.  Musculoskeletal: Negative for arthralgias and back pain.  Skin: Negative for color change and rash.  Neurological: Negative for seizures and syncope.  All other systems reviewed and are negative.    Physical Exam Updated Vital Signs BP 108/64   Pulse 89   Temp 98.1 F (36.7 C) (Oral)   Ht 5\' 5"  (1.651 m)   Wt 129 lb (58.5 kg)   LMP 05/24/2016 (Approximate)   SpO2 96%   BMI 21.47 kg/m   Physical Exam  Constitutional: She is oriented to person, place, and time. She appears well-developed and well-nourished. No distress.  HENT:  Head: Normocephalic  and atraumatic.  Nose: Nose normal.  Eyes: Conjunctivae and EOM are normal. Pupils are equal, round, and reactive to light. Right eye exhibits no discharge. Left eye exhibits no discharge. No scleral icterus.  Neck: Normal range of motion. Neck supple.  Cardiovascular: Normal rate and regular rhythm.  Exam reveals no gallop and no friction rub.   No murmur heard. Pulmonary/Chest: Effort normal and breath sounds normal. No stridor. No respiratory distress. She has no rales.  Abdominal: Soft. She exhibits no distension. There is no tenderness.  Gravid. FHT 120s.  Musculoskeletal: She exhibits no edema or  tenderness.  Neurological: She is alert and oriented to person, place, and time.  Skin: Skin is warm and dry. No rash noted. She is not diaphoretic. No erythema.  Psychiatric: She has a normal mood and affect.  Vitals reviewed.    ED Treatments / Results  Labs (all labs ordered are listed, but only abnormal results are displayed) Labs Reviewed - No data to display  EKG  EKG Interpretation None       Radiology No results found.  Procedures Procedures (including critical care time)  Medications Ordered in ED Medications  metoCLOPramide (REGLAN) tablet 5 mg (5 mg Oral Given 10/19/16 1447)     Initial Impression / Assessment and Plan / ED Course  I have reviewed the triage vital signs and the nursing notes.  Pertinent labs & imaging results that were available during my care of the patient were reviewed by me and considered in my medical decision making (see chart for details).  Clinical Course     Consistent with gastroenteritis. Patient is well-appearing, well-hydrated, nontoxic. Abdomen benign. Good fetal heart tones. Given antibiotics and able to tolerate by mouth challenge. No indication for labs or tests imaging at this time.  The patient is safe for discharge with strict return precautions.   Final Clinical Impressions(s) / ED Diagnoses   Final diagnoses:  Nausea vomiting and diarrhea   Disposition: Discharge  Condition: Good  I have discussed the results, Dx and Tx plan with the patient who expressed understanding and agree(s) with the plan. Discharge instructions discussed at great length. The patient was given strict return precautions who verbalized understanding of the instructions. No further questions at time of discharge.    New Prescriptions   METOCLOPRAMIDE (REGLAN) 10 MG TABLET    Take 0.5 tablets (5 mg total) by mouth every 6 (six) hours as needed for nausea.    Follow Up: [facility] [address] [place] [phone] Call  For help establishing care with a care provider      Nira ConnPedro Eduardo Sostenes Kauffmann, MD 10/19/16 [phone]

## 2016-11-05 ENCOUNTER — Encounter: Payer: Self-pay | Admitting: Advanced Practice Midwife

## 2016-11-05 ENCOUNTER — Ambulatory Visit (INDEPENDENT_AMBULATORY_CARE_PROVIDER_SITE_OTHER): Payer: Medicaid Other | Admitting: Advanced Practice Midwife

## 2016-11-05 VITALS — BP 99/61 | HR 91 | Wt 133.6 lb

## 2016-11-05 DIAGNOSIS — O0932 Supervision of pregnancy with insufficient antenatal care, second trimester: Secondary | ICD-10-CM | POA: Insufficient documentation

## 2016-11-05 DIAGNOSIS — O0990 Supervision of high risk pregnancy, unspecified, unspecified trimester: Secondary | ICD-10-CM

## 2016-11-05 DIAGNOSIS — Z3492 Encounter for supervision of normal pregnancy, unspecified, second trimester: Secondary | ICD-10-CM

## 2016-11-05 DIAGNOSIS — O0991 Supervision of high risk pregnancy, unspecified, first trimester: Secondary | ICD-10-CM

## 2016-11-05 DIAGNOSIS — O09892 Supervision of other high risk pregnancies, second trimester: Secondary | ICD-10-CM

## 2016-11-05 DIAGNOSIS — Z3490 Encounter for supervision of normal pregnancy, unspecified, unspecified trimester: Secondary | ICD-10-CM | POA: Insufficient documentation

## 2016-11-05 LAB — POCT URINALYSIS DIP (DEVICE)
BILIRUBIN URINE: NEGATIVE
GLUCOSE, UA: NEGATIVE mg/dL
Hgb urine dipstick: NEGATIVE
Leukocytes, UA: NEGATIVE
Nitrite: NEGATIVE
PROTEIN: 30 mg/dL — AB
SPECIFIC GRAVITY, URINE: 1.02 (ref 1.005–1.030)
Urobilinogen, UA: 1 mg/dL (ref 0.0–1.0)
pH: 7 (ref 5.0–8.0)

## 2016-11-05 NOTE — Progress Notes (Signed)
  Subjective:    Mia James is a [id number] being seen today for her first obstetrical visit.  Her obstetrical history is significant for Late to care. And Closely Spaced Pregnancies (2015,2016, 2018) Patient does intend to breast feed. Pregnancy history fully reviewed.  Patient reports no complaints.  Vitals:   11/05/16 1250  BP: 99/61  Pulse: 91  Weight: 133 lb 9.6 oz (60.6 kg)    HISTORY: OB History  Gravida Para Term Preterm AB Living  3 2 2  0 0 2  SAB TAB Ectopic Multiple Live Births  0 0 0 0 2    # Outcome Date GA Lbr Len/2nd Weight Sex Delivery Anes PTL Lv  3 Current           2 Term 07/12/15 [id number] 10:04 / 00:08 7 lb 12 oz (3.515 kg) F Vag-Spont None  LIV  1 Term 2015   7 lb 14 oz (3.572 kg) M Vag-Spont EPI N LIV     Past Medical History:  Diagnosis Date  . Anemia   . Eczema   . Infection    UTI   Past Surgical History:  Procedure Laterality Date  . NO PAST SURGERIES     Family History  Problem Relation Age of Onset  . Heart disease Maternal Grandmother   . Diabetes Maternal Grandfather      Exam    Uterus:  Fundal Height: 23 cm  Pelvic Exam:    Perineum: Not examined (Done in August in [facility])   Vulva: N/a   Vagina:  normal discharge   pH:    Cervix: n/a   Adnexa: not evaluated   Bony Pelvis: gynecoid  System: Breast:  normal appearance, no masses or tenderness   Skin: normal coloration and turgor, no rashes    Neurologic: oriented, grossly non-focal   Extremities: normal strength, tone, and muscle mass   HEENT neck supple with midline trachea   Mouth/Teeth mucous membranes moist, pharynx normal without lesions   Neck supple and no masses   Cardiovascular: regular rate and rhythm   Respiratory:  appears well, vitals normal, no respiratory distress, acyanotic, normal RR, ear and throat exam is normal, neck free of mass or lymphadenopathy, chest clear, no wheezing, crepitations, rhonchi, normal symmetric air entry   Abdomen: soft,  non-tender; bowel sounds normal; no masses,  no organomegaly   Urinary: n/a      Assessment:    Pregnancy: [id number] Patient Active Problem List   Diagnosis Date Noted  . Supervision of low-risk pregnancy 11/05/2016  . Short interval between pregnancies affecting pregnancy in second trimester, antepartum 11/05/2016  . Late prenatal care affecting pregnancy in second trimester 11/05/2016        Plan:     Initial labs drawn. Prenatal vitamins. Problem list reviewed and updated. Genetic Screening discussed Quad Screen: ordered.  Ultrasound discussed; fetal survey: ordered.  Follow up in 4 weeks. 50% of 30 min visit spent on counseling and coordination of care.  Routines reviewed Wants a waterbirth. States could not have one last two pregnancies due to being postdates and induced.  Will check to see if 2016 waterbirth class certificate would still be valid   Mia BourgeoisMarie Monti James 11/05/2016

## 2016-11-05 NOTE — Progress Notes (Signed)
Decline flu  

## 2016-11-05 NOTE — Patient Instructions (Signed)
Second Trimester of Pregnancy The second trimester is from week 13 through week 28, month 4 through 6. This is often the time in pregnancy that you feel your best. Often times, morning sickness has lessened or quit. You may have more energy, and you may get hungry more often. Your unborn baby (fetus) is growing rapidly. At the end of the sixth month, he or she is about 9 inches long and weighs about 1 pounds. You will likely feel the baby move (quickening) between 18 and 20 weeks of pregnancy. Follow these instructions at home:  Avoid all smoking, herbs, and alcohol. Avoid drugs not approved by your doctor.  Do not use any tobacco products, including cigarettes, chewing tobacco, and electronic cigarettes. If you need help quitting, ask your doctor. You may get counseling or other support to help you quit.  Only take medicine as told by your doctor. Some medicines are safe and some are not during pregnancy.  Exercise only as told by your doctor. Stop exercising if you start having cramps.  Eat regular, healthy meals.  Wear a good support bra if your breasts are tender.  Do not use hot tubs, steam rooms, or saunas.  Wear your seat belt when driving.  Avoid raw meat, uncooked cheese, and liter boxes and soil used by cats.  Take your prenatal vitamins.  Take 1500-2000 milligrams of calcium daily starting at the 20th week of pregnancy until you deliver your baby.  Try taking medicine that helps you poop (stool softener) as needed, and if your doctor approves. Eat more fiber by eating fresh fruit, vegetables, and whole grains. Drink enough fluids to keep your pee (urine) clear or pale yellow.  Take warm water baths (sitz baths) to soothe pain or discomfort caused by hemorrhoids. Use hemorrhoid cream if your doctor approves.  If you have puffy, bulging veins (varicose veins), wear support hose. Raise (elevate) your feet for 15 minutes, 3-4 times a day. Limit salt in your diet.  Avoid heavy  lifting, wear low heals, and sit up straight.  Rest with your legs raised if you have leg cramps or low back pain.  Visit your dentist if you have not gone during your pregnancy. Use a soft toothbrush to brush your teeth. Be gentle when you floss.  You can have sex (intercourse) unless your doctor tells you not to.  Go to your doctor visits. Get help if:  You feel dizzy.  You have mild cramps or pressure in your lower belly (abdomen).  You have a nagging pain in your belly area.  You continue to feel sick to your stomach (nauseous), throw up (vomit), or have watery poop (diarrhea).  You have bad smelling fluid coming from your vagina.  You have pain with peeing (urination). Get help right away if:  You have a fever.  You are leaking fluid from your vagina.  You have spotting or bleeding from your vagina.  You have severe belly cramping or pain.  You lose or gain weight rapidly.  You have trouble catching your breath and have chest pain.  You notice sudden or extreme puffiness (swelling) of your face, hands, ankles, feet, or legs.  You have not felt the baby move in over an hour.  You have severe headaches that do not go away with medicine.  You have vision changes. This information is not intended to replace advice given to you by your health care provider. Make sure you discuss any questions you have with your health care   provider. Document Released: 01/30/2010 Document Revised: 04/12/2016 Document Reviewed: 01/06/2013 Elsevier Interactive Patient Education  2017 [employer].  

## 2016-11-06 LAB — AFP, QUAD SCREEN
AFP: 88.9 ng/mL
CURR GEST AGE: 22.9 wk
HCG TOTAL: 9.01 [IU]/mL
INH: 225.2 pg/mL
INTERPRETATION-AFP: NEGATIVE
MOM FOR HCG: 0.46
MoM for AFP: 0.9
MoM for INH: 0.95
OPEN SPINA BIFIDA: NEGATIVE
Osb Risk: 1:38000 {titer}
TRI 18 SCR RISK EST: POSITIVE — AB
Trisomy 18 (Edward) Syndrome Interp.: >1:52 {titer}
UE3 VALUE: 0.78 ng/mL
uE3 Mom: 0.33

## 2016-11-06 LAB — PRENATAL PROFILE (SOLSTAS)
Antibody Screen: NEGATIVE
BASOS ABS: 0 {cells}/uL (ref 0–200)
BASOS PCT: 0 %
EOS PCT: 3 %
Eosinophils Absolute: 243 cells/uL (ref 15–500)
HEMATOCRIT: 29 % — AB (ref 35.0–45.0)
HEMOGLOBIN: 9.5 g/dL — AB (ref 11.7–15.5)
HEP B S AG: NEGATIVE
HIV 1&2 Ab, 4th Generation: NONREACTIVE
LYMPHS ABS: 2349 {cells}/uL (ref 850–3900)
Lymphocytes Relative: 29 %
MCH: 26 pg — AB (ref 27.0–33.0)
MCHC: 32.8 g/dL (ref 32.0–36.0)
MCV: 79.5 fL — AB (ref 80.0–100.0)
MPV: 9.7 fL (ref 7.5–12.5)
Monocytes Absolute: 648 cells/uL (ref 200–950)
Monocytes Relative: 8 %
NEUTROS ABS: 4860 {cells}/uL (ref 1500–7800)
Neutrophils Relative %: 60 %
Platelets: 293 10*3/uL (ref 140–400)
RBC: 3.65 MIL/uL — AB (ref 3.80–5.10)
RDW: 14.2 % (ref 11.0–15.0)
Rh Type: POSITIVE
Rubella: 1.98 Index — ABNORMAL HIGH (ref <0.90)
WBC: 8.1 10*3/uL (ref 3.8–10.8)

## 2016-11-06 LAB — PAIN MGMT, PROFILE 6 CONF W/O MM, U
6 ACETYLMORPHINE: NEGATIVE ng/mL (ref <10)
ALCOHOL METABOLITES: NEGATIVE ng/mL (ref <500)
AMPHETAMINES: NEGATIVE ng/mL (ref <500)
Barbiturates: NEGATIVE ng/mL (ref <300)
Benzodiazepines: NEGATIVE ng/mL (ref <100)
Cocaine Metabolite: NEGATIVE ng/mL (ref <150)
Creatinine: 174.8 mg/dL (ref >=20.0)
Marijuana Metabolite: NEGATIVE ng/mL (ref <20)
Methadone Metabolite: NEGATIVE ng/mL (ref <100)
OXIDANT: NEGATIVE ug/mL (ref <200)
Opiates: NEGATIVE ng/mL (ref <100)
Oxycodone: NEGATIVE ng/mL (ref <100)
PH: 7.23 (ref 4.5–9.0)
PLEASE NOTE: 0
Phencyclidine: NEGATIVE ng/mL (ref <25)

## 2016-11-07 LAB — HEMOGLOBINOPATHY EVALUATION
HEMATOCRIT: 29 % — AB (ref 35.0–45.0)
HEMOGLOBIN: 9.5 g/dL — AB (ref 11.7–15.5)
Hgb A2 Quant: 1.9 % (ref 1.8–3.5)
Hgb A: 97.1 % (ref >96.0)
Hgb F Quant: <1 % (ref <2.0)
MCH: 26 pg — ABNORMAL LOW (ref 27.0–33.0)
MCV: 79.5 fL — ABNORMAL LOW (ref 80.0–100.0)
RBC: 3.65 MIL/uL — ABNORMAL LOW (ref 3.80–5.10)
RDW: 14.2 % (ref 11.0–15.0)

## 2016-11-07 LAB — CULTURE, OB URINE
Colony Count: NO GROWTH
ORGANISM ID, BACTERIA: NO GROWTH

## 2016-11-13 ENCOUNTER — Ambulatory Visit (HOSPITAL_COMMUNITY)
Admission: RE | Admit: 2016-11-13 | Discharge: 2016-11-13 | Disposition: A | Discharge status: Home / Self Care | Payer: No Typology Code available for payment source | Source: Ambulatory Visit | Attending: Advanced Practice Midwife | Admitting: Advanced Practice Midwife

## 2016-11-13 ENCOUNTER — Encounter (HOSPITAL_COMMUNITY): Payer: Self-pay

## 2016-11-13 ENCOUNTER — Other Ambulatory Visit: Payer: Self-pay | Admitting: Advanced Practice Midwife

## 2016-11-13 DIAGNOSIS — O289 Unspecified abnormal findings on antenatal screening of mother: Secondary | ICD-10-CM | POA: Insufficient documentation

## 2016-11-13 DIAGNOSIS — Z3A24 24 weeks gestation of pregnancy: Secondary | ICD-10-CM | POA: Insufficient documentation

## 2016-11-13 DIAGNOSIS — Z363 Encounter for antenatal screening for malformations: Secondary | ICD-10-CM | POA: Diagnosis not present

## 2016-11-13 DIAGNOSIS — O0991 Supervision of high risk pregnancy, unspecified, first trimester: Secondary | ICD-10-CM

## 2016-11-16 ENCOUNTER — Encounter (HOSPITAL_COMMUNITY): Payer: Self-pay | Admitting: Advanced Practice Midwife

## 2016-11-16 DIAGNOSIS — O289 Unspecified abnormal findings on antenatal screening of mother: Secondary | ICD-10-CM | POA: Insufficient documentation

## 2016-11-19 ENCOUNTER — Inpatient Hospital Stay (HOSPITAL_COMMUNITY)
Admission: AD | Admit: 2016-11-19 | Discharge: 2016-11-19 | Disposition: A | Discharge status: Home / Self Care | Payer: Medicaid Other | Source: Ambulatory Visit | Attending: Obstetrics and Gynecology | Admitting: Obstetrics and Gynecology

## 2016-11-19 ENCOUNTER — Encounter (HOSPITAL_COMMUNITY): Payer: Self-pay | Admitting: *Deleted

## 2016-11-19 DIAGNOSIS — R1084 Generalized abdominal pain: Secondary | ICD-10-CM

## 2016-11-19 DIAGNOSIS — Z3A24 24 weeks gestation of pregnancy: Secondary | ICD-10-CM | POA: Diagnosis not present

## 2016-11-19 DIAGNOSIS — D649 Anemia, unspecified: Secondary | ICD-10-CM | POA: Insufficient documentation

## 2016-11-19 DIAGNOSIS — O368121 Decreased fetal movements, second trimester, fetus 1: Secondary | ICD-10-CM

## 2016-11-19 DIAGNOSIS — Z8249 Family history of ischemic heart disease and other diseases of the circulatory system: Secondary | ICD-10-CM | POA: Diagnosis not present

## 2016-11-19 DIAGNOSIS — O26892 Other specified pregnancy related conditions, second trimester: Secondary | ICD-10-CM | POA: Insufficient documentation

## 2016-11-19 DIAGNOSIS — Z87440 Personal history of urinary (tract) infections: Secondary | ICD-10-CM | POA: Insufficient documentation

## 2016-11-19 DIAGNOSIS — O368120 Decreased fetal movements, second trimester, not applicable or unspecified: Secondary | ICD-10-CM | POA: Insufficient documentation

## 2016-11-19 DIAGNOSIS — Z833 Family history of diabetes mellitus: Secondary | ICD-10-CM | POA: Insufficient documentation

## 2016-11-19 DIAGNOSIS — R109 Unspecified abdominal pain: Secondary | ICD-10-CM | POA: Diagnosis not present

## 2016-11-19 LAB — URINALYSIS, ROUTINE W REFLEX MICROSCOPIC
Bilirubin Urine: NEGATIVE
GLUCOSE, UA: NEGATIVE mg/dL
HGB URINE DIPSTICK: NEGATIVE
Ketones, ur: NEGATIVE mg/dL
NITRITE: NEGATIVE
Protein, ur: 30 mg/dL — AB
SPECIFIC GRAVITY, URINE: 1.017 (ref 1.005–1.030)
pH: 8 (ref 5.0–8.0)

## 2016-11-19 NOTE — Discharge Instructions (Signed)
Abdominal Pain During Pregnancy Belly (abdominal) pain is common during pregnancy. Most of the time, it is not a serious problem. Other times, it can be a sign that something is wrong with the pregnancy. Always tell your doctor if you have belly pain. Follow these instructions at home: Monitor your belly pain for any changes. The following actions may help you feel better:  Do not have sex (intercourse) or put anything in your vagina until you feel better.  Rest until your pain stops.  Drink clear fluids if you feel sick to your stomach (nauseous). Do not eat solid food until you feel better.  Only take medicine as told by your doctor.  Keep all doctor visits as told. Get help right away if:  You are bleeding, leaking fluid, or pieces of tissue come out of your vagina.  You have more pain or cramping.  You keep throwing up (vomiting).  You have pain when you pee (urinate) or have blood in your pee.  You have a fever.  You do not feel your baby moving as much.  You feel very weak or feel like passing out.  You have trouble breathing, with or without belly pain.  You have a very bad headache and belly pain.  You have fluid leaking from your vagina and belly pain.  You keep having watery poop (diarrhea).  Your belly pain does not go away after resting, or the pain gets worse. This information is not intended to replace advice given to you by your health care provider. Make sure you discuss any questions you have with your health care provider. Document Released: 10/24/2009 Document Revised: 06/13/2016 Document Reviewed: 06/04/2013 Elsevier Interactive Patient Education  2017 [employer].  Introduction Patient Name: ________________________________________________ Patient Due Date: ____________________ What is a fetal movement count? A fetal movement count is the number of times that you feel your baby move during a certain amount of time. This may also be called a fetal  kick count. A fetal movement count is recommended for every pregnant woman. You may be asked to start counting fetal movements as early as week 28 of your pregnancy. Pay attention to when your baby is most active. You may notice your baby's sleep and wake cycles. You may also notice things that make your baby move more. You should do a fetal movement count:  When your baby is normally most active.  At the same time each day. A good time to count movements is while you are resting, after having something to eat and drink. How do I count fetal movements? 1. Find a quiet, comfortable area. Sit, or lie down on your side. 2. Write down the date, the start time and stop time, and the number of movements that you felt between those two times. Take this information with you to your health care visits. 3. For 2 hours, count kicks, flutters, swishes, rolls, and jabs. You should feel at least 10 movements during 2 hours. 4. You may stop counting after you have felt 10 movements. 5. If you do not feel 10 movements in 2 hours, have something to eat and drink. Then, keep resting and counting for 1 hour. If you feel at least 4 movements during that hour, you may stop counting. Contact a health care provider if:  You feel fewer than 4 movements in 2 hours.  Your baby is not moving like he or she usually does. Date: ____________ Start time: ____________ Stop time: ____________ Movements: ____________ Date: ____________ Start  time: ____________ Stop time: ____________ Movements: ____________ Date: ____________ Start time: ____________ Stop time: ____________ Movements: ____________ Date: ____________ Start time: ____________ Stop time: ____________ Movements: ____________ Date: ____________ Start time: ____________ Stop time: ____________ Movements: ____________ Date: ____________ Start time: ____________ Stop time: ____________ Movements: ____________ Date: ____________ Start time: ____________ Stop time:  ____________ Movements: ____________ Date: ____________ Start time: ____________ Stop time: ____________ Movements: ____________ Date: ____________ Start time: ____________ Stop time: ____________ Movements: ____________ This information is not intended to replace advice given to you by your health care provider. Make sure you discuss any questions you have with your health care provider. Document Released: 12/05/2006 Document Revised: 07/04/2016 Document Reviewed: 12/15/2015 Elsevier Interactive Patient Education  2017 [employer].

## 2016-11-19 NOTE — MAU Provider Note (Signed)
History     CSN: [id number]  Arrival date and time: 11/19/16 2222   First Provider Initiated Contact with Patient 11/19/16 2311      No chief complaint on file.  Patient is a 21 year old G3 P2 at 24 weeks and 6 days who presents today for decreased fetal movement. She additionally reports that she's had some cramping on the right side of her abdomen. She reports it specifically does not feel like Braxton Hicks contractions. She reports to decreased fetal movement started yesterday afternoon and then she felt no movement at all today until she arrived here in the MA U and the monitors were placed on the patient. She did recently receive Naisbitt she has an increased risk of having a baby with Down syndrome and has further testing pending. She has no other complaints today. She denies any vaginal bleeding or vaginal discharge    OB History    Gravida Para Term Preterm AB Living   3 2 2  0 0 2   SAB TAB Ectopic Multiple Live Births   0 0 0 0 2      Past Medical History:  Diagnosis Date  . Anemia   . Eczema   . Infection    UTI    Past Surgical History:  Procedure Laterality Date  . NO PAST SURGERIES      Family History  Problem Relation Age of Onset  . Heart disease Maternal Grandmother   . Diabetes Maternal Grandfather     Social History  Substance Use Topics  . Smoking status: Never Smoker  . Smokeless tobacco: Never Used  . Alcohol use No    Allergies: No Known Allergies  Prescriptions Prior to Admission  Medication Sig Dispense Refill Last Dose  . Prenatal MV-Min-FA-Omega-3 (PRENATAL GUMMIES/DHA & FA) 0.4-32.5 MG CHEW Chew 2 each by mouth daily.   11/19/2016 at Unknown time  . metoCLOPramide (REGLAN) 10 MG tablet Take 0.5 tablets (5 mg total) by mouth every 6 (six) hours as needed for nausea. 12 tablet 0     Review of Systems  Constitutional: Negative for chills and fever.  Respiratory: Negative for cough and shortness of breath.   Cardiovascular: Negative  for chest pain and palpitations.  Gastrointestinal: Positive for abdominal pain. Negative for constipation, diarrhea, heartburn, nausea and vomiting.  Genitourinary: Negative for dysuria, frequency and urgency.  Musculoskeletal: Negative for back pain, myalgias and neck pain.  Skin: Negative for rash.  Neurological: Negative for dizziness and headaches.   Physical Exam   Blood pressure 99/59, pulse 80, temperature 97.7 F (36.5 C), temperature source Oral, resp. rate 20, height 5\' 5"  (1.651 m), weight 137 lb 8 oz (62.4 kg), last menstrual period 05/24/2016, unknown if currently breastfeeding.  Physical Exam  Vitals reviewed. Constitutional: She is oriented to person, place, and time. She appears well-developed and well-nourished.  Cardiovascular: Normal rate and intact distal pulses.   Respiratory: Effort normal. No respiratory distress.  GI: Soft. Bowel sounds are normal. She exhibits no distension. There is tenderness. There is no rebound and no guarding.  Genitourinary:  Genitourinary Comments: Cervix closed thick and high external genitalia normal  Neurological: She is alert and oriented to person, place, and time. No cranial nerve deficit.  Skin: Skin is warm and dry. No erythema.  Psychiatric: She has a normal mood and affect. Her behavior is normal.    MAU Course  Procedures  MDM In MA U patient underwent fetal monitoring which revealed a fetal heart rate in the 150s.  Patient reported significant fetal movement while in MAU. Patient's cervix was checked and was closed thick and high. A fetal fibronectin was collected prior to checking but given cervical exam was not sent for analysis.  Advised patient abdominal pain was likely musculoskeletal. Patient okay with discharge home.  Assessment and Plan  #1: Decreased fetal movement: In MAU patient had reassuring fetal heart tones and reported regular fetal movement. Patient given information on fetal kick counts. # 2: Patient  with abdominal pain. Likely musculoskeletal in nature. Minimally reproducible bowl with palpation. Advised heating pads and Tylenol. Patient voiced understanding.

## 2016-11-19 NOTE — MAU Note (Signed)
PT  SAYS  SHE HAS NOT  FELT  BABY MOVE   SINCE  10PM- YESTERDAY.     CRAMPS  STARTED  ON  Tuesday -   BAD  ON RIGHT  SIDE .    [facility].

## 2016-11-21 ENCOUNTER — Other Ambulatory Visit: Payer: Self-pay | Admitting: Maternal and Fetal Medicine

## 2016-11-21 LAB — CULTURE, OB URINE

## 2016-11-22 ENCOUNTER — Telehealth (HOSPITAL_COMMUNITY): Payer: Self-pay | Admitting: MS"

## 2016-11-22 NOTE — Telephone Encounter (Signed)
Called [place] N Von[place] to discuss her prenatal cell free DNA test results.  Ms. Cecelia ByarsBrittany N Perko had Panorama testing through [facility].  Testing was offered because of abnormal Quad screen results.   The patient was identified by name and DOB.  We reviewed that these are within normal limits, showing a less than 1 in 10,000 risk for trisomies 21, 18 and 13, and monosomy X (Turner syndrome).  In addition, the risk for triploidy and sex chromosome trisomies (47,XXX and 47,XXY) was also low risk.  Analysis for 22q11 deletion syndrome was also low risk (1 in 9,000).  We reviewed that this testing identifies > 99% of pregnancies with trisomy 1421, trisomy 7013, sex chromosome trisomies (47,XXX and 47,XXY), and triploidy. The detection rate for trisomy 18 is >98%.  The detection rate for monosomy X is ~94%.  The false positive rate is <0.1% for all conditions. Testing was also consistent with female fetal sex.   She understands that this testing does not identify all genetic conditions.  All questions were answered to her satisfaction, she was encouraged to call with additional questions or concerns.  Quinn PlowmanKaren Loula Marcella, MS Certified Genetic Counselor 11/22/2016 10:30 AM

## 2016-12-04 ENCOUNTER — Encounter: Payer: Medicaid Other | Admitting: Obstetrics and Gynecology

## 2016-12-07 ENCOUNTER — Inpatient Hospital Stay (HOSPITAL_COMMUNITY)
Admission: AD | Admit: 2016-12-07 | Discharge: 2016-12-07 | Disposition: A | Discharge status: Home / Self Care | Payer: Medicaid Other | Source: Ambulatory Visit | Attending: Obstetrics and Gynecology | Admitting: Obstetrics and Gynecology

## 2016-12-07 ENCOUNTER — Encounter (HOSPITAL_COMMUNITY): Payer: Self-pay

## 2016-12-07 DIAGNOSIS — Z87440 Personal history of urinary (tract) infections: Secondary | ICD-10-CM | POA: Diagnosis not present

## 2016-12-07 DIAGNOSIS — O2342 Unspecified infection of urinary tract in pregnancy, second trimester: Secondary | ICD-10-CM | POA: Insufficient documentation

## 2016-12-07 DIAGNOSIS — D649 Anemia, unspecified: Secondary | ICD-10-CM | POA: Insufficient documentation

## 2016-12-07 DIAGNOSIS — Z3A27 27 weeks gestation of pregnancy: Secondary | ICD-10-CM | POA: Diagnosis not present

## 2016-12-07 DIAGNOSIS — O99012 Anemia complicating pregnancy, second trimester: Secondary | ICD-10-CM | POA: Diagnosis not present

## 2016-12-07 DIAGNOSIS — Z8619 Personal history of other infectious and parasitic diseases: Secondary | ICD-10-CM | POA: Diagnosis not present

## 2016-12-07 DIAGNOSIS — Z833 Family history of diabetes mellitus: Secondary | ICD-10-CM | POA: Insufficient documentation

## 2016-12-07 DIAGNOSIS — O26892 Other specified pregnancy related conditions, second trimester: Secondary | ICD-10-CM | POA: Diagnosis present

## 2016-12-07 DIAGNOSIS — Z8249 Family history of ischemic heart disease and other diseases of the circulatory system: Secondary | ICD-10-CM | POA: Insufficient documentation

## 2016-12-07 LAB — URINALYSIS, ROUTINE W REFLEX MICROSCOPIC
BILIRUBIN URINE: NEGATIVE
GLUCOSE, UA: NEGATIVE mg/dL
KETONES UR: NEGATIVE mg/dL
NITRITE: NEGATIVE
PH: 7 (ref 5.0–8.0)
PROTEIN: NEGATIVE mg/dL
Specific Gravity, Urine: 1.001 — ABNORMAL LOW (ref 1.005–1.030)

## 2016-12-07 LAB — CBC WITH DIFFERENTIAL/PLATELET
BASOS ABS: 0 10*3/uL (ref 0.0–0.1)
BASOS PCT: 0 %
EOS PCT: 1 %
Eosinophils Absolute: 0.1 10*3/uL (ref 0.0–0.7)
HEMATOCRIT: 26.3 % — AB (ref 36.0–46.0)
Hemoglobin: 8.8 g/dL — ABNORMAL LOW (ref 12.0–15.0)
Lymphocytes Relative: 24 %
Lymphs Abs: 2.5 10*3/uL (ref 0.7–4.0)
MCH: 26.6 pg (ref 26.0–34.0)
MCHC: 33.5 g/dL (ref 30.0–36.0)
MCV: 79.5 fL (ref 78.0–100.0)
MONO ABS: 0.3 10*3/uL (ref 0.1–1.0)
MONOS PCT: 3 %
NEUTROS ABS: 7.5 10*3/uL (ref 1.7–7.7)
Neutrophils Relative %: 72 %
PLATELETS: 229 10*3/uL (ref 150–400)
RBC: 3.31 MIL/uL — ABNORMAL LOW (ref 3.87–5.11)
RDW: 13.8 % (ref 11.5–15.5)
WBC: 10.5 10*3/uL (ref 4.0–10.5)

## 2016-12-07 LAB — OB RESULTS CONSOLE GBS: GBS: NEGATIVE

## 2016-12-07 MED ORDER — CEPHALEXIN 500 MG PO CAPS: 500.0000 mg | ORAL_CAPSULE | Freq: Four times a day (QID) | ORAL | 0 refills | Status: DC

## 2016-12-07 MED ORDER — FERROUS SULFATE 325 (65 FE) MG PO TABS: 325.0000 mg | ORAL_TABLET | Freq: Two times a day (BID) | ORAL | 1 refills | Status: DC

## 2016-12-07 NOTE — MAU Note (Signed)
Urine in lab 

## 2016-12-07 NOTE — MAU Provider Note (Signed)
History     CSN: [id number]  Arrival date and time: 12/07/16 1857   First Provider Initiated Contact with Patient 12/07/16 1936      No chief complaint on file.  [id number] @27 .3 weeks here with hematuria, lower back pain, and lower abdominal pain. She had 3 episodes of hematuria today. She reports back pain started late last night. She reports lower abdominal pain since this am. She describes as sharp and mostly occurs when needs to void or just after. She endorses dysuria x5 days. She had temp of 100.3 yesterday. She has been using AZO and cranberry pill/juice x4 day. She felt this was helping initially but no longer. She reports good FM. No VB, LOF, vaginal discharge, and ctx.   OB History    Gravida Para Term Preterm AB Living   3 2 2  0 0 2   SAB TAB Ectopic Multiple Live Births   0 0 0 0 2      Past Medical History:  Diagnosis Date  . Anemia   . Eczema   . Infection    UTI    Past Surgical History:  Procedure Laterality Date  . NO PAST SURGERIES      Family History  Problem Relation Age of Onset  . Heart disease Maternal Grandmother   . Diabetes Maternal Grandfather     Social History  Substance Use Topics  . Smoking status: Never Smoker  . Smokeless tobacco: Never Used  . Alcohol use No    Allergies: No Known Allergies  Prescriptions Prior to Admission  Medication Sig Dispense Refill Last Dose  . Prenatal MV-Min-FA-Omega-3 (PRENATAL GUMMIES/DHA & FA) 0.4-32.5 MG CHEW Chew 2 each by mouth daily.   12/07/2016 at Unknown time   Results for orders placed or performed during the hospital encounter of 12/07/16 (from the past 48 hour(s))  Urinalysis, Routine w reflex microscopic     Status: Abnormal   Collection Time: 12/07/16  7:00 PM  Result Value Ref Range   Color, Urine STRAW (A) YELLOW   APPearance HAZY (A) CLEAR   Specific Gravity, Urine 1.001 (L) 1.005 - 1.030   pH 7.0 5.0 - 8.0   Glucose, UA NEGATIVE NEGATIVE mg/dL   Hgb urine dipstick LARGE (A)  NEGATIVE   Bilirubin Urine NEGATIVE NEGATIVE   Ketones, ur NEGATIVE NEGATIVE mg/dL   Protein, ur NEGATIVE NEGATIVE mg/dL   Nitrite NEGATIVE NEGATIVE   Leukocytes, UA LARGE (A) NEGATIVE   RBC / HPF 0-5 0 - 5 RBC/hpf   WBC, UA 6-30 0 - 5 WBC/hpf   Bacteria, UA RARE (A) NONE SEEN   Squamous Epithelial / LPF 0-5 (A) NONE SEEN   Mucous PRESENT   CBC with Differential/Platelet     Status: Abnormal   Collection Time: 12/07/16  7:49 PM  Result Value Ref Range   WBC 10.5 4.0 - 10.5 K/uL   RBC 3.31 (L) 3.87 - 5.11 MIL/uL   Hemoglobin 8.8 (L) 12.0 - 15.0 g/dL   HCT 14.7 (L) 82.9 - 56.2 %   MCV 79.5 78.0 - 100.0 fL   MCH 26.6 26.0 - 34.0 pg   MCHC 33.5 30.0 - 36.0 g/dL   RDW 13.0 86.5 - 78.4 %   Platelets 229 150 - 400 K/uL   Neutrophils Relative % 72 %   Neutro Abs 7.5 1.7 - 7.7 K/uL   Lymphocytes Relative 24 %   Lymphs Abs 2.5 0.7 - 4.0 K/uL   Monocytes Relative 3 %   Monocytes Absolute  0.3 0.1 - 1.0 K/uL   Eosinophils Relative 1 %   Eosinophils Absolute 0.1 0.0 - 0.7 K/uL   Basophils Relative 0 %   Basophils Absolute 0.0 0.0 - 0.1 K/uL    Review of Systems Physical Exam   Blood pressure 109/59, pulse 84, temperature 98.4 F (36.9 C), temperature source Oral, resp. rate 20, last menstrual period 05/24/2016, unknown if currently breastfeeding.  Physical Exam  Constitutional: She is oriented to person, place, and time. She appears well-developed and well-nourished. No distress.  HENT:  Head: Normocephalic and atraumatic.  Neck: Normal range of motion.  Cardiovascular: Normal rate.   Respiratory: Effort normal.  GI: Soft. She exhibits no distension. There is no tenderness.  gravid  Genitourinary:  Genitourinary Comments: External: no lesions or erythema Vagina: rugated, parous, thin, white discharge, No blood SVE: closed/thick  Musculoskeletal: Normal range of motion.  Neurological: She is alert and oriented to person, place, and time.  Skin: Skin is warm and dry.   Psychiatric: She has a normal mood and affect.  EFM: 145 bpm, mod variability, + accels, no decels Toco: none  MAU Course  Procedures  MDM Labs ordered and reviewed. Transfer of care given to [place]oah Bartosiewicz, NP Donette LarryMelanie Bhambri, CNM  12/07/2016 8:14 PM  Resumed care of the patient from [facility] CN[facility].    Assessment and Plan    A:  1. UTI in pregnancy, antepartum, second trimester   2. Anemia in pregnancy, second trimester     P:  Discharge home in stable condition Rx: Keflex, Iron Return to MAU if symptoms worsen  Follow up with OB as scheduled  Urine culture pending.   Duane LopeJennifer I Salem Mastrogiovanni, NP 12/07/2016 8:30 PM

## 2016-12-07 NOTE — Progress Notes (Addendum)
G3P2 @ 27.[redacted] wksga. Presents to triage for bleeding in the urine. Denies LOF. States +FM. EFM applied. VSS see flow sheet for details.   2000: speculum exam for bleeding. SVE closed.   2040: Discharge instructions provided with pt understanding.  Pt left unit via ambulatory with husband and kids.

## 2016-12-07 NOTE — Discharge Instructions (Signed)
Pregnancy and Urinary Tract Infection WHAT IS A URINARY TRACT INFECTION? A urinary tract infection (UTI) is an infection of any part of the urinary tract. This includes the kidneys, the tubes that connect your kidneys to your bladder (ureters), the bladder, and the tube that carries urine out of your body (urethra). These organs make, store, and get rid of urine in the body. A UTI can be a bladder infection (cystitis) or a kidney infection (pyelonephritis). This infection may be caused by fungi, viruses, and bacteria. Bacteria are the most common cause of UTIs. You are more likely to develop a UTI during pregnancy because:  The physical and hormonal changes your body goes through can make it easier for bacteria to get into your urinary tract.  Your growing baby puts pressure on your uterus and can affect urine flow. DOES A UTI PLACE MY BABY AT RISK? An untreated UTI during pregnancy could lead to a kidney infection, which can cause health problems that could affect your baby. Possible complications of an untreated UTI include:  Having your baby before 37 weeks of pregnancy (premature).  Having a baby with a low birth weight.  Developing high blood pressure during pregnancy (preeclampsia). WHAT ARE THE SYMPTOMS OF A UTI? Symptoms of a UTI include:  Fever.  Frequent urination or passing small amounts of urine frequently.  Needing to urinate urgently.  Pain or a burning sensation with urination.  Urine that smells bad or unusual.  Cloudy urine.  Pain in the lower abdomen or back.  Trouble urinating.  Blood in the urine.  Vomiting or being less hungry than normal.  Diarrhea or abdominal pain.  Vaginal discharge. WHAT ARE THE TREATMENT OPTIONS FOR A UTI DURING PREGNANCY? Treatment for this condition may include:  Antibiotic medicines that are safe to take during pregnancy.  Other medicines to treat less common causes of UTI. HOW CAN I PREVENT A UTI? To prevent a UTI:  Go  to the bathroom as soon as you feel the need.  Always wipe from front to back.  Wash your genital area with soap and warm water daily.  Empty your bladder before and after sex.  Wear cotton underwear.  Limit your intake of high sugar foods or drinks, such as regular soda, juice, and sweets..  Drink 6-8 glasses of water daily.  Do not wear tight-fitting pants.  Do not douche or use deodorant sprays.  Do not drink alcohol, caffeine, or carbonated drinks. These can irritate the bladder. WHEN SHOULD I SEEK MEDICAL CARE? Seek medical care if:  Your symptoms do not improve or get worse.  You have a fever after two days of treatment.  You have a rash.  You have abnormal vaginal discharge.  You have back or side pain.  You have chills.  You have nausea and vomiting. WHEN SHOULD I SEEK IMMEDIATE MEDICAL CARE? Seek immediate medical care if you are pregnant and:  You feel contractions in your uterus.  You have lower belly pain.  You have a gush of fluid from your vagina.  You have blood in your urine.  You are vomiting and cannot keep down any medicines or water. This information is not intended to replace advice given to you by your health care provider. Make sure you discuss any questions you have with your health care provider. Document Released: 03/02/2011 Document Revised: 04/09/2016 Document Reviewed: 09/26/2015 Elsevier Interactive Patient Education  2017 [employer].  Iron Deficiency Anemia, Adult Iron-deficiency anemia is when you have a low  amount of red blood cells or hemoglobin. This happens because you have too little iron in your body. Hemoglobin carries oxygen to parts of the body. Anemia can cause your body to not get enough oxygen. It may or may not cause symptoms. Follow these instructions at home: Medicines  Take over-the-counter and prescription medicines only as told by your doctor. This includes iron pills (supplements) and vitamins.  If you  cannot handle taking iron pills by mouth, ask your doctor about getting iron through:  A vein (intravenously).  A shot (injection) into a muscle.  Take iron pills when your stomach is empty. If you cannot handle this, take them with food.  Do not drink milk or take antacids at the same time as your iron pills.  To prevent trouble pooping (constipation), eat fiber or take medicine (stool softener) as told by your doctor. Eating and drinking  Talk with your doctor before changing the foods you eat. He or she may tell you to eat foods that have a lot of iron, such as:  Liver.  Lowfat (lean) beef.  Breads and cereals that have iron added to them (fortified breads and cereals).  Eggs.  Dried fruit.  Dark green, leafy vegetables.  Drink enough fluid to keep your pee (urine) clear or pale yellow.  Eat fresh fruits and vegetables that are high in vitamin C. They help your body to use iron. Foods with a lot of vitamin C include:  Oranges.  Peppers.  Tomatoes.  Mangoes. General instructions  Return to your normal activities as told by your doctor. Ask your doctor what activities are safe for you.  Keep yourself clean, and keep things clean around you (your surroundings). Anemia can make you get sick more easily.  Keep all follow-up visits as told by your doctor. This is important. Contact a doctor if:  You feel sick to your stomach (nauseous).  You throw up (vomit).  You feel weak.  You are sweating for no clear reason.  You have trouble pooping, such as:  Pooping (having a bowel movement) less than 3 times a week.  Straining to poop.  Having poop that is hard, dry, or larger than normal.  Feeling full or bloated.  Pain in the lower belly.  Not feeling better after pooping. Get help right away if:  You pass out (faint). If this happens, do not drive yourself to the hospital. Call your local emergency services (911 in the [place]  You have chest  pain.  You have shortness of breath that:  Is very bad.  Gets worse with physical activity.  You have a fast heartbeat.  You get light-headed when getting up from sitting or lying down. This information is not intended to replace advice given to you by your health care provider. Make sure you discuss any questions you have with your health care provider. Document Released: 12/08/2010 Document Revised: 07/25/2016 Document Reviewed: 07/25/2016 Elsevier Interactive Patient Education  2017 [employer]

## 2016-12-09 LAB — CULTURE, OB URINE: Special Requests: NORMAL

## 2017-03-11 ENCOUNTER — Encounter (HOSPITAL_COMMUNITY): Payer: Self-pay

## 2017-03-11 ENCOUNTER — Inpatient Hospital Stay (HOSPITAL_COMMUNITY)
Admission: AD | Admit: 2017-03-11 | Discharge: 2017-03-11 | Discharge status: Against Medical Advice | Payer: Medicaid Other | Source: Ambulatory Visit | Attending: Obstetrics & Gynecology | Admitting: Obstetrics & Gynecology

## 2017-03-11 ENCOUNTER — Inpatient Hospital Stay (HOSPITAL_COMMUNITY): Payer: Medicaid Other

## 2017-03-11 DIAGNOSIS — O99012 Anemia complicating pregnancy, second trimester: Secondary | ICD-10-CM

## 2017-03-11 DIAGNOSIS — Z833 Family history of diabetes mellitus: Secondary | ICD-10-CM | POA: Insufficient documentation

## 2017-03-11 DIAGNOSIS — O9913 Other diseases of the blood and blood-forming organs and certain disorders involving the immune mechanism complicating the puerperium: Secondary | ICD-10-CM | POA: Insufficient documentation

## 2017-03-11 DIAGNOSIS — Z5321 Procedure and treatment not carried out due to patient leaving prior to being seen by health care provider: Secondary | ICD-10-CM | POA: Insufficient documentation

## 2017-03-11 DIAGNOSIS — D6489 Other specified anemias: Secondary | ICD-10-CM | POA: Insufficient documentation

## 2017-03-11 DIAGNOSIS — O368330 Maternal care for abnormalities of the fetal heart rate or rhythm, third trimester, not applicable or unspecified: Secondary | ICD-10-CM | POA: Insufficient documentation

## 2017-03-11 DIAGNOSIS — Z8249 Family history of ischemic heart disease and other diseases of the circulatory system: Secondary | ICD-10-CM | POA: Diagnosis not present

## 2017-03-11 DIAGNOSIS — O480 Post-term pregnancy: Secondary | ICD-10-CM | POA: Diagnosis not present

## 2017-03-11 DIAGNOSIS — Z3A40 40 weeks gestation of pregnancy: Secondary | ICD-10-CM

## 2017-03-11 DIAGNOSIS — Z87440 Personal history of urinary (tract) infections: Secondary | ICD-10-CM | POA: Insufficient documentation

## 2017-03-11 DIAGNOSIS — O368130 Decreased fetal movements, third trimester, not applicable or unspecified: Secondary | ICD-10-CM | POA: Insufficient documentation

## 2017-03-11 NOTE — MAU Provider Note (Signed)
History     CSN: [id number]  Arrival date and time: 03/11/17 1006   None     Chief Complaint  Patient presents with  . Contractions   Patient is a 21 y/o G3P2 who presents to MAU with concerns for labor. She reports contractions of increasing intensity but reports they are only coming every 5-15 minutes. She has no other concerns. She reports fetal movement and no vaginal bleeding.     OB History    Gravida Para Term Preterm AB Living   0 0 2   SAB TAB Ectopic Multiple Live Births   0 0 0 0 2      Past Medical History:  Diagnosis Date  . Anemia   . Eczema   . Infection    UTI    Past Surgical History:  Procedure Laterality Date  . NO PAST SURGERIES      Family History  Problem Relation Age of Onset  . Heart disease Maternal Grandmother   . Diabetes Maternal Grandfather     Social History  Substance Use Topics  . Smoking status: Never Smoker  . Smokeless tobacco: Never Used  . Alcohol use No    Allergies: No Known Allergies  Prescriptions Prior to Admission  Medication Sig Dispense Refill Last Dose  . ferrous sulfate 325 (65 FE) MG tablet Take 1 tablet (325 mg total) by mouth 2 (two) times daily with a meal. 60 tablet 1 Past Week at Unknown time  . Prenatal MV-Min-FA-Omega-3 (PRENATAL GUMMIES/DHA & FA) 0.4-32.5 MG CHEW Chew 2 each by mouth daily.   03/10/2017 at Unknown time  . cephALEXin (KEFLEX) 500 MG capsule Take 1 capsule (500 mg total) by mouth 4 (four) times daily. (Patient not taking: Reported on 03/11/2017) 28 capsule 0 Completed Course at Unknown time    Review of Systems  Constitutional: Negative for chills and fever.  HENT: Negative for congestion and rhinorrhea.   Respiratory: Negative for cough and shortness of breath.   Gastrointestinal: Positive for abdominal pain. Negative for abdominal distention, constipation, diarrhea, nausea and vomiting.  Genitourinary: Negative for difficulty urinating, dysuria, enuresis and frequency.   Neurological: Negative for dizziness and weakness.   Physical Exam   Blood pressure 117/72, pulse 91, temperature 97.8 F (36.6 C), temperature source Oral, resp. rate 18, height  (1.651 m), weight 136 lb (61.7 kg), last menstrual period 05/24/2016, SpO2 100 %, unknown if currently breastfeeding.  Physical Exam  Vitals reviewed. Constitutional: She is oriented to person, place, and time. She appears well-developed and well-nourished.  HENT:  Head: Normocephalic and atraumatic.  Eyes: Conjunctivae are normal. Pupils are equal, round, and reactive to light.  Cardiovascular: Normal rate and intact distal pulses.   Respiratory: Effort normal. No respiratory distress.  GI: She exhibits no distension. There is no tenderness. There is no rebound and no guarding.  Musculoskeletal: Normal range of motion. She exhibits no edema.  Neurological: She is alert and oriented to person, place, and time. She displays normal reflexes. Coordination normal.  Skin: Skin is warm and dry.  Psychiatric: She has a normal mood and affect. Her behavior is normal.    MAU Course  Procedures  MDM In [place] patient underwent continuous Fetal heart rate monitoring and was found to have decelerations with most contractions. She had a BPP which was 8/8.  Despite 8/8 BPP given gestational age and decelerations with a favorable cervix in a multip I recommended delivery today with pitocin/AROM. Patient voiced understanding, but  was concerned about child care and her partners job. She expressed interest in going home.   She was informed that Her unborn baby could die without continuous monitoring and the ability to react to worsening tracing. She voiced understanding and continue to want to leave. She states she will return at [facility] or so for inductions.  Assessment and Plan  1. Fetal heart tracing with decelerations. Recommend induction patient refuses and left AMA voicing understanding of risks including  IUFD  Ernestina Penna 03/11/2017, 1:36 PM

## 2017-03-11 NOTE — Progress Notes (Signed)
Dr Genevie Ann at bedside discussing plan of care with pt.

## 2017-03-11 NOTE — Progress Notes (Signed)
Pt states she would like to sign out AMA. Pt states she has childcare issues and cannot stay to be induced. Dr Genevie Ann at bedside and discussed with pt.

## 2017-03-11 NOTE — MAU Note (Signed)
Pt c/o contractions that are irregular that started this morning at 3am. Pt states she started prenatal care in [place] but then transferred to [facility] in [place]. Pt states baby is moving less than normal today. Pt denies bleeding and leaking.

## 2017-03-11 NOTE — Progress Notes (Signed)
Pt signed AMA form. Dr Genevie Ann discussed with patient the importance of staying in hospital and the potential that her baby could die if she leaves. Pt verbalizes understanding.

## 2017-03-14 ENCOUNTER — Telehealth: Payer: Self-pay | Admitting: Family Medicine

## 2017-03-14 NOTE — Telephone Encounter (Signed)
Called patient to check on her, she was seen in MAU on Monday, was concerned about her tracing (BPP was 8/8), were planning on having the patient be induced, but she wanted to go home to take care of her kids first. She was supposed to return for IOL that evening, but never showed up. Patient answered the phone, states she already delivered (yesterday), at home after her water broke and baby came fast. She was in [place], and went to a nearby hospital. States she is already at home, everything was fine and baby is doing well.  Cleda Clarks, DO  OB Fellow [facility] 03/14/2017 2:43 PM

## 2017-05-18 ENCOUNTER — Encounter (HOSPITAL_COMMUNITY): Payer: Self-pay

## 2017-10-18 ENCOUNTER — Encounter (HOSPITAL_COMMUNITY): Payer: Self-pay | Admitting: *Deleted

## 2017-10-18 ENCOUNTER — Inpatient Hospital Stay (HOSPITAL_COMMUNITY)
Admission: AD | Admit: 2017-10-18 | Discharge: 2017-10-18 | Disposition: A | Discharge status: Home / Self Care | Payer: Self-pay | Source: Ambulatory Visit | Attending: Obstetrics & Gynecology | Admitting: Obstetrics & Gynecology

## 2017-10-18 DIAGNOSIS — Z8249 Family history of ischemic heart disease and other diseases of the circulatory system: Secondary | ICD-10-CM | POA: Insufficient documentation

## 2017-10-18 DIAGNOSIS — R1110 Vomiting, unspecified: Secondary | ICD-10-CM | POA: Insufficient documentation

## 2017-10-18 DIAGNOSIS — A084 Viral intestinal infection, unspecified: Secondary | ICD-10-CM

## 2017-10-18 DIAGNOSIS — D649 Anemia, unspecified: Secondary | ICD-10-CM | POA: Insufficient documentation

## 2017-10-18 DIAGNOSIS — Z79899 Other long term (current) drug therapy: Secondary | ICD-10-CM | POA: Insufficient documentation

## 2017-10-18 DIAGNOSIS — Z833 Family history of diabetes mellitus: Secondary | ICD-10-CM | POA: Insufficient documentation

## 2017-10-18 LAB — URINALYSIS, ROUTINE W REFLEX MICROSCOPIC
Bilirubin Urine: NEGATIVE
GLUCOSE, UA: NEGATIVE mg/dL
Hgb urine dipstick: NEGATIVE
Ketones, ur: NEGATIVE mg/dL
Leukocytes, UA: NEGATIVE
Nitrite: NEGATIVE
PH: 5 (ref 5.0–8.0)
Protein, ur: 100 mg/dL — AB
Specific Gravity, Urine: 1.021 (ref 1.005–1.030)

## 2017-10-18 LAB — POCT PREGNANCY, URINE: Preg Test, Ur: NEGATIVE

## 2017-10-18 MED ORDER — ONDANSETRON 4 MG PO TBDP: 4.0000 mg | ORAL_TABLET | Freq: Three times a day (TID) | ORAL | 0 refills | Status: DC | PRN

## 2017-10-18 MED ORDER — ONDANSETRON 8 MG PO TBDP
8.0000 mg | ORAL_TABLET | Freq: Once | ORAL | Status: AC
  Administered 2017-10-18: 8 mg via ORAL
  Filled 2017-10-18: qty 1

## 2017-10-18 NOTE — MAU Note (Addendum)
PT SAYS LMP WAS 10-25-    NO HPT  .   NO BIRTH CONTROL.   SAYS  SHE CAME [place] FROM [place] ON Monday.    FELT CRAMPS ON  SAT THEN BECAME WORSE AND NOW WORSE.  VOMITING STARTED TONIGHT . HER SONS   71 YEAROLD AND 7 MTHS OLD   ARE VOMITING.    THE 21 YEAR OLD   WAS SICK IN [place] BUT  AFTER HE FEEL- TOOK HIM  TO HOSPITAL.     TOOK IBUPROFEN AT 9PM  - 400 MG    BREAST FEEDING.   ALSO DIARRHEA

## 2017-10-18 NOTE — MAU Provider Note (Signed)
History     CSN: [id number]  Arrival date and time: 10/18/17 [id number]   First Provider Initiated Contact with Patient 10/18/17 0540      Chief Complaint  Patient presents with  . Emesis   Mia James is a 21 y.o. Non-pregnant female with new onset N/V/D. [place], and states that both of her kids also have N/V/D.    Emesis   This is a new problem. Episode onset: started having cramps about 5 days ago, and vomiting 2 days ago  The problem occurs 2 to 4 times per day. The problem has been gradually worsening. The emesis has an appearance of stomach contents. There has been no fever. Associated symptoms include abdominal pain and diarrhea. Pertinent negatives include no chills or fever. Risk factors include ill contacts (both children have been sick with nausea and vomiting. ). Treatments tried: ibuprofen  The treatment provided no relief.     Past Medical History:  Diagnosis Date  . Anemia   . Eczema   . Infection    UTI    Past Surgical History:  Procedure Laterality Date  . NO PAST SURGERIES      Family History  Problem Relation Age of Onset  . Heart disease Maternal Grandmother   . Diabetes Maternal Grandfather     Social History   Tobacco Use  . Smoking status: Never Smoker  . Smokeless tobacco: Never Used  Substance Use Topics  . Alcohol use: No  . Drug use: No    Allergies: No Known Allergies  Medications Prior to Admission  Medication Sig Dispense Refill Last Dose  . ferrous sulfate 325 (65 FE) MG tablet Take 1 tablet (325 mg total) by mouth 2 (two) times daily with a meal. 60 tablet 1 10/18/2017 at Unknown time  . Prenatal MV-Min-FA-Omega-3 (PRENATAL GUMMIES/DHA & FA) 0.4-32.5 MG CHEW Chew 2 each by mouth daily.   10/18/2017 at Unknown time  . cephALEXin (KEFLEX) 500 MG capsule Take 1 capsule (500 mg total) by mouth 4 (four) times daily. (Patient not taking: Reported on 03/11/2017) 28 capsule 0 Completed Course at  Unknown time    Review of Systems  Constitutional: Negative for chills and fever.  Gastrointestinal: Positive for abdominal pain, diarrhea, nausea and vomiting.  Genitourinary: Negative for dysuria, frequency, urgency, vaginal bleeding and vaginal discharge.   Physical Exam   Temperature 98.4 F (36.9 C), temperature source Oral, resp. rate 18, height 5\' 5"  (1.651 m), weight 123 lb (55.8 kg), last menstrual period 09/12/2017, unknown if currently breastfeeding.  Physical Exam  Nursing note and vitals reviewed. Constitutional: She is oriented to person, place, and time. She appears well-developed and well-nourished. No distress.  HENT:  Head: Normocephalic.  Cardiovascular: Normal rate.  Respiratory: Effort normal.  GI: Soft. There is no tenderness. There is no rebound.  Neurological: She is alert and oriented to person, place, and time.  Skin: Skin is warm and dry.  Psychiatric: She has a normal mood and affect.     Results for orders placed or performed during the hospital encounter of 10/18/17 (from the past 24 hour(s))  Pregnancy, urine POC     Status: None   Collection Time: 10/18/17  5:37 AM  Result Value Ref Range   Preg Test, Ur NEGATIVE NEGATIVE    MAU Course  Procedures  MDM   Assessment and Plan   1. Viral gastroenteritis    DC home Comfort measures reviewed  BRAT diet RX: zofran  PRN #20  Return to MAU as needed for emergencies  FU with PCP  Follow-up Information    Department, [facility].   Contact information: [address] [place] [address] [phone]            Thressa ShellerHeather Tomesha Sargent 10/18/2017, 5:41 AM

## 2017-10-18 NOTE — Discharge Instructions (Signed)
In late 2019, the [facility] will be moving to the [facility]. At that time, the MAU (Maternity Admissions Unit), where you are being seen today, will no longer see non-pregnant patients. We strongly encourage you to find a doctor's office before that time, so that you can be seen with any GYN concerns, like vaginal discharge, urinary tract infection, etc.. in a timely manner.   In order to make the office visit more convenient, the Center for [facility] at [facility] will be offering evening hours from 4pm-7:30pm on Monday. There will be same-day appointments, walk-in appointments and scheduled appointments available during this time. We will be adding more evening hours over the next year before the move.   [facility] @ [facility] [phone]- [phone]  For urgent needs, [facility] is also available for management of urgent GYN complaints such as vaginal discharge or urinary tract infections.   Primary care follow up  [facility] (will see you even if you do not have sickle cell): [phone] [facility]: [phone] [facility]: [phone]    Viral Gastroenteritis, Adult Viral gastroenteritis is also known as the stomach flu. This condition is caused by various viruses. These viruses can be passed from person to person very easily (are very contagious). This condition may affect your stomach, small intestine, and large intestine. It can cause sudden watery diarrhea, fever, and vomiting. Diarrhea and vomiting can make you feel weak and cause you to become dehydrated. You may not be able to keep fluids down. Dehydration can make you tired and thirsty, cause you to have a dry mouth, and decrease how often you urinate. Older adults and people with other diseases or a weak immune system are at higher risk for dehydration. It is important to replace the fluids that you lose from diarrhea and  vomiting. If you become severely dehydrated, you may need to get fluids through an IV tube. What are the causes? Gastroenteritis is caused by various viruses, including rotavirus and norovirus. Norovirus is the most common cause in adults. You can get sick by eating food, drinking water, or touching a surface contaminated with one of these viruses. You can also get sick from sharing utensils or other personal items with an infected person. What increases the risk? This condition is more likely to develop in people:  Who have a weak defense system (immune system).  Who live with one or more children who are younger than 21 years old.  Who live in a nursing home.  Who go on cruise ships.  What are the signs or symptoms? Symptoms of this condition start suddenly 1-2 days after exposure to a virus. Symptoms may last a few days or as long as a week. The most common symptoms are watery diarrhea and vomiting. Other symptoms include:  Fever.  Headache.  Fatigue.  Pain in the abdomen.  Chills.  Weakness.  Nausea.  Muscle aches.  Loss of appetite.  How is this diagnosed? This condition is diagnosed with a medical history and physical exam. You may also have a stool test to check for viruses or other infections. How is this treated? This condition typically goes away on its own. The focus of treatment is to restore lost fluids (rehydration). Your health care provider may recommend that you take an oral rehydration solution (ORS) to replace important salts and minerals (electrolytes) in your body. Severe cases of this condition may require giving fluids through an  IV tube. Treatment may also include medicine to help with your symptoms. Follow these instructions at home: Follow instructions from your health care provider about how to care for yourself at home. Eating and drinking Follow these recommendations as told by your health care provider:  Take an ORS. This is a drink that is  sold at pharmacies and retail stores.  Drink clear fluids in small amounts as you are able. Clear fluids include water, ice chips, diluted fruit juice, and low-calorie sports drinks.  Eat bland, easy-to-digest foods in small amounts as you are able. These foods include bananas, applesauce, rice, lean meats, toast, and crackers.  Avoid fluids that contain a lot of sugar or caffeine, such as energy drinks, sports drinks, and soda.  Avoid alcohol.  Avoid spicy or fatty foods.  General instructions   Drink enough fluid to keep your urine clear or pale yellow.  Wash your hands often. If soap and water are not available, use hand sanitizer.  Make sure that all people in your household wash their hands well and often.  Take over-the-counter and prescription medicines only as told by your health care provider.  Rest at home while you recover.  Watch your condition for any changes.  Take a warm bath to relieve any burning or pain from frequent diarrhea episodes.  Keep all follow-up visits as told by your health care provider. This is important. Contact a health care provider if:  You cannot keep fluids down.  Your symptoms get worse.  You have new symptoms.  You feel light-headed or dizzy.  You have muscle cramps. Get help right away if:  You have chest pain.  You feel extremely weak or you faint.  You see blood in your vomit.  Your vomit looks like coffee grounds.  You have bloody or black stools or stools that look like tar.  You have a severe headache, a stiff neck, or both.  You have a rash.  You have severe pain, cramping, or bloating in your abdomen.  You have trouble breathing or you are breathing very quickly.  Your heart is beating very quickly.  Your skin feels cold and clammy.  You feel confused.  You have pain when you urinate.  You have signs of dehydration, such as: ? Dark urine, very little urine, or no urine. ? Cracked lips. ? Dry  mouth. ? Sunken eyes. ? Sleepiness. ? Weakness. This information is not intended to replace advice given to you by your health care provider. Make sure you discuss any questions you have with your health care provider. Document Released: 11/05/2005 Document Revised: 04/18/2016 Document Reviewed: 07/12/2015 Elsevier Interactive Patient Education  2017 [employer]

## 2018-05-22 ENCOUNTER — Other Ambulatory Visit: Payer: Self-pay

## 2018-05-22 ENCOUNTER — Inpatient Hospital Stay (HOSPITAL_COMMUNITY)
Admission: AD | Admit: 2018-05-22 | Discharge: 2018-05-22 | Disposition: A | Discharge status: Home / Self Care | Payer: Federal, State, Local not specified - PPO | Source: Ambulatory Visit | Attending: Obstetrics and Gynecology | Admitting: Obstetrics and Gynecology

## 2018-05-22 ENCOUNTER — Encounter (HOSPITAL_COMMUNITY): Payer: Self-pay | Admitting: *Deleted

## 2018-05-22 ENCOUNTER — Ambulatory Visit (HOSPITAL_COMMUNITY)
Admission: EM | Admit: 2018-05-22 | Discharge: 2018-05-22 | Disposition: A | Discharge status: Home / Self Care | Payer: Federal, State, Local not specified - PPO | Source: Home / Self Care

## 2018-05-22 DIAGNOSIS — R519 Headache, unspecified: Secondary | ICD-10-CM

## 2018-05-22 DIAGNOSIS — Z79899 Other long term (current) drug therapy: Secondary | ICD-10-CM | POA: Insufficient documentation

## 2018-05-22 DIAGNOSIS — G501 Atypical facial pain: Secondary | ICD-10-CM | POA: Diagnosis not present

## 2018-05-22 DIAGNOSIS — R220 Localized swelling, mass and lump, head: Secondary | ICD-10-CM | POA: Diagnosis not present

## 2018-05-22 DIAGNOSIS — L089 Local infection of the skin and subcutaneous tissue, unspecified: Secondary | ICD-10-CM | POA: Diagnosis not present

## 2018-05-22 DIAGNOSIS — R51 Headache: Secondary | ICD-10-CM

## 2018-05-22 MED ORDER — CEPHALEXIN 500 MG PO CAPS: 500.0000 mg | ORAL_CAPSULE | Freq: Three times a day (TID) | ORAL | 0 refills | Status: DC

## 2018-05-22 NOTE — Discharge Instructions (Signed)
Use warm compresses 3 times daily for 20 minutes together with the antibiotic Keflex. Take the antibiotic with food and see about picking up a probiotic.

## 2018-05-22 NOTE — MAU Note (Signed)
Pt reports she has had drainage from an abcess near her hear for the last 4 days and now her jaw and face are swollen.

## 2018-05-22 NOTE — MAU Provider Note (Signed)
History     CSN: [id number]  Arrival date and time: 05/22/18 1717   First Provider Initiated Contact with Patient 05/22/18 1737      Chief Complaint  Patient presents with  . Facial Swelling   HPI [name] is a 22 y.o. [phone] who presents to MAU today with complaint of swelling and drainage near her right ear. She states that she had a similar issue as a child, but hasn't had it since. She states it is a "sinus." She denies current drainage or fever. The surrounding area is tender to touch. She states subjective fever earlier in the week, but never took her temperature. She denies changes in hearing or vision.   OB History    Gravida  3   Para  3   Term  3   Preterm  0   AB  0   Living  3     SAB  0   TAB  0   Ectopic  0   Multiple  0   Live Births  3           Past Medical History:  Diagnosis Date  . Anemia   . Eczema   . Infection    UTI    Past Surgical History:  Procedure Laterality Date  . NO PAST SURGERIES      Family History  Problem Relation Age of Onset  . Heart disease Maternal Grandmother   . Diabetes Maternal Grandfather     Social History   Tobacco Use  . Smoking status: Never Smoker  . Smokeless tobacco: Never Used  Substance Use Topics  . Alcohol use: No  . Drug use: No    Allergies: No Known Allergies  Medications Prior to Admission  Medication Sig Dispense Refill Last Dose  . cephALEXin (KEFLEX) 500 MG capsule Take 1 capsule (500 mg total) by mouth 4 (four) times daily. (Patient not taking: Reported on 03/11/2017) 28 capsule 0 Completed Course at Unknown time  . ferrous sulfate 325 (65 FE) MG tablet Take 1 tablet (325 mg total) by mouth 2 (two) times daily with a meal. 60 tablet 1 10/18/2017 at Unknown time  . ondansetron (ZOFRAN ODT) 4 MG disintegrating tablet Take 1 tablet (4 mg total) by mouth every 8 (eight) hours as needed for nausea or vomiting. 20 tablet 0   . Prenatal MV-Min-FA-Omega-3 (PRENATAL  GUMMIES/DHA & FA) 0.4-32.5 MG CHEW Chew 2 each by mouth daily.   10/18/2017 at Unknown time    Review of Systems  Constitutional: Positive for fever.  HENT: Positive for ear pain and facial swelling. Negative for congestion and hearing loss.   Eyes: Negative for pain.  Respiratory: Negative for shortness of breath.    Physical Exam   Blood pressure 100/70, pulse 85, temperature 98.7 F (37.1 C), temperature source Oral, resp. rate 16, height 5\' 5"  (1.651 m), weight 177 lb (80.3 kg), last menstrual period 05/14/2018, SpO2 100 %, unknown if currently breastfeeding.  Physical Exam  Constitutional: She is oriented to person, place, and time. She appears well-developed and well-nourished. No distress.  HENT:  Head: Normocephalic and atraumatic.  Right Ear: There is swelling and tenderness. No drainage.  Ears:  Eyes: EOM are normal.  Neck: Normal range of motion.  Cardiovascular: Normal rate.  Respiratory: Effort normal.  Neurological: She is alert and oriented to person, place, and time.  Skin: Skin is warm and dry. No erythema.  Psychiatric: She has a normal mood and affect.  MAU Course  Procedures None  MDM Patient evaluated and without any signs of anaphylaxis, SOB or difficulty breathing. No changes in vision or signs of acute infection noted. Patient advised that she should consider having a PCP or EDP evaluate the condition.  Assessment and Plan  A: Facial swelling  P:  Discharged to go to [facility] by POV for further evaluation  Warning signs for worsening condition discussed Patient may return to MAU as needed however advised patient that PCP, UC or ED would be best for worsening condition  Vonzella Nipple, PA-C 05/22/2018, 5:37 PM

## 2018-05-22 NOTE — ED Provider Notes (Signed)
  MRN: [id number] DOB: 1996/03/17  Subjective:   Mia James is a 22 y.o. female presenting for 4 day history of right sided facial pain. Reports there is redness, swelling. Denies fever, drainage of pus or bleeding, blisters.  Denies taking current medications. Has No Known Allergies.  Denies smoking cigarettes.   Past Medical History:  Diagnosis Date  . Anemia   . Eczema   . Infection    UTI    Past Surgical History:  Procedure Laterality Date  . NO PAST SURGERIES      Objective:   Vitals: BP (!) 143/76   Pulse 78   Temp 98.2 F (36.8 C) (Oral)   Resp 16   LMP 05/14/2018   SpO2 100%   Breastfeeding? Yes   Physical Exam  Constitutional: She is oriented to person, place, and time. She appears well-developed and well-nourished.  HENT:  Right Ear: Tympanic membrane normal.  Left Ear: Tympanic membrane normal.  Nose: No mucosal edema, rhinorrhea or sinus tenderness.  Mouth/Throat: Oropharynx is clear and moist.  Cardiovascular: Normal rate.  Pulmonary/Chest: Effort normal.  Neurological: She is alert and oriented to person, place, and time.   Assessment and Plan :   Superficial skin infection  Facial pain  Will start Keflex for superficial skin infection. Use warm compresses. Return-to-clinic precautions discussed, patient verbalized understanding.    Wallis BambergMani, Azhar Knope, [place]-C 05/22/18 1818

## 2018-05-22 NOTE — ED Triage Notes (Signed)
Pt reports having a "sinus" above right ear; states sinus started draining 4 days ago.  Now feeling feverish & having swelling into right face.

## 2018-10-18 ENCOUNTER — Inpatient Hospital Stay (HOSPITAL_COMMUNITY)
Admission: AD | Admit: 2018-10-18 | Discharge: 2018-10-18 | Disposition: A | Discharge status: Home / Self Care | Payer: Federal, State, Local not specified - PPO | Source: Ambulatory Visit | Attending: Obstetrics and Gynecology | Admitting: Obstetrics and Gynecology

## 2018-10-18 ENCOUNTER — Inpatient Hospital Stay (HOSPITAL_COMMUNITY): Payer: Federal, State, Local not specified - PPO

## 2018-10-18 ENCOUNTER — Other Ambulatory Visit: Payer: Self-pay

## 2018-10-18 ENCOUNTER — Encounter (HOSPITAL_COMMUNITY): Payer: Self-pay

## 2018-10-18 DIAGNOSIS — O418X10 Other specified disorders of amniotic fluid and membranes, first trimester, not applicable or unspecified: Secondary | ICD-10-CM

## 2018-10-18 DIAGNOSIS — Z87440 Personal history of urinary (tract) infections: Secondary | ICD-10-CM | POA: Insufficient documentation

## 2018-10-18 DIAGNOSIS — Z3491 Encounter for supervision of normal pregnancy, unspecified, first trimester: Secondary | ICD-10-CM

## 2018-10-18 DIAGNOSIS — Z3A01 Less than 8 weeks gestation of pregnancy: Secondary | ICD-10-CM | POA: Insufficient documentation

## 2018-10-18 DIAGNOSIS — O209 Hemorrhage in early pregnancy, unspecified: Secondary | ICD-10-CM | POA: Diagnosis not present

## 2018-10-18 DIAGNOSIS — O468X1 Other antepartum hemorrhage, first trimester: Secondary | ICD-10-CM

## 2018-10-18 DIAGNOSIS — N939 Abnormal uterine and vaginal bleeding, unspecified: Secondary | ICD-10-CM | POA: Diagnosis present

## 2018-10-18 DIAGNOSIS — O208 Other hemorrhage in early pregnancy: Secondary | ICD-10-CM | POA: Diagnosis not present

## 2018-10-18 DIAGNOSIS — O26891 Other specified pregnancy related conditions, first trimester: Secondary | ICD-10-CM | POA: Insufficient documentation

## 2018-10-18 DIAGNOSIS — R109 Unspecified abdominal pain: Secondary | ICD-10-CM | POA: Insufficient documentation

## 2018-10-18 LAB — URINALYSIS, ROUTINE W REFLEX MICROSCOPIC
Bilirubin Urine: NEGATIVE
GLUCOSE, UA: NEGATIVE mg/dL
HGB URINE DIPSTICK: NEGATIVE
Ketones, ur: NEGATIVE mg/dL
Leukocytes, UA: NEGATIVE
Nitrite: NEGATIVE
PH: 6 (ref 5.0–8.0)
PROTEIN: NEGATIVE mg/dL
Specific Gravity, Urine: 1.023 (ref 1.005–1.030)

## 2018-10-18 LAB — CBC
HEMATOCRIT: 33.4 % — AB (ref 36.0–46.0)
Hemoglobin: 10.7 g/dL — ABNORMAL LOW (ref 12.0–15.0)
MCH: 25.4 pg — AB (ref 26.0–34.0)
MCHC: 32 g/dL (ref 30.0–36.0)
MCV: 79.3 fL — AB (ref 80.0–100.0)
PLATELETS: 256 10*3/uL (ref 150–400)
RBC: 4.21 MIL/uL (ref 3.87–5.11)
RDW: 13.5 % (ref 11.5–15.5)
WBC: 5.6 10*3/uL (ref 4.0–10.5)
nRBC: 0 % (ref 0.0–0.2)

## 2018-10-18 LAB — WET PREP, GENITAL
CLUE CELLS WET PREP: NONE SEEN
SPERM: NONE SEEN
TRICH WET PREP: NONE SEEN
Yeast Wet Prep HPF POC: NONE SEEN

## 2018-10-18 LAB — POCT PREGNANCY, URINE: Preg Test, Ur: POSITIVE — AB

## 2018-10-18 LAB — HCG, QUANTITATIVE, PREGNANCY: hCG, Beta Chain, Quant, S: 66858 m[IU]/mL — ABNORMAL HIGH (ref <5)

## 2018-10-18 NOTE — Discharge Instructions (Signed)
Subchorionic Hematoma °A subchorionic hematoma is a gathering of blood between the outer wall of the placenta and the inner wall of the womb (uterus). The placenta is the organ that connects the fetus to the wall of the uterus. The placenta performs the feeding, breathing (oxygen to the fetus), and waste removal (excretory work) of the fetus. °Subchorionic hematoma is the most common abnormality found on a result from ultrasonography done during the first trimester or early second trimester of pregnancy. If there has been little or no vaginal bleeding, early small hematomas usually shrink on their own and do not affect your baby or pregnancy. The blood is gradually absorbed over 1-2 weeks. When bleeding starts later in pregnancy or the hematoma is larger or occurs in an older pregnant woman, the outcome may not be as good. Larger hematomas may get bigger, which increases the chances for miscarriage. Subchorionic hematoma also increases the risk of premature detachment of the placenta from the uterus, preterm (premature) labor, and stillbirth. °Follow these instructions at home: °· Stay on bed rest if your health care provider recommends this. Although bed rest will not prevent more bleeding or prevent a miscarriage, your health care provider may recommend bed rest until you are advised otherwise. °· Avoid heavy lifting (more than 10 lb [4.5 kg]), exercise, sexual intercourse, or douching as directed by your health care provider. °· Keep track of the number of pads you use each day and how soaked (saturated) they are. Write down this information. °· Do not use tampons. °· Keep all follow-up appointments as directed by your health care provider. Your health care provider may ask you to have follow-up blood tests or ultrasound tests or both. °Get help right away if: °· You have severe cramps in your stomach, back, abdomen, or pelvis. °· You have a fever. °· You pass large clots or tissue. Save any tissue for your  health care provider to look at. °· Your bleeding increases or you become lightheaded, feel weak, or have fainting episodes. °This information is not intended to replace advice given to you by your health care provider. Make sure you discuss any questions you have with your health care provider. °Document Released: 02/20/2007 Document Revised: 04/12/2016 Document Reviewed: 06/04/2013 °Elsevier Interactive Patient Education © 2017 Elsevier Inc. ° °

## 2018-10-18 NOTE — MAU Note (Addendum)
Mia James is a 22 y.o.  here in MAU reporting: +lower abdominal pain. Cramping in nature. Constant.  +vaginal bleeding. Notices when wipes.  LMP: 08/31/18. +HPT Onset of complaint: started yesterday Pain score: 4-5/10 Denies any recent sexual intercourse Vitals:   10/18/18 1107  BP: 103/67  Pulse: 80  Resp: 16  Temp: 98.1 F (36.7 C)  SpO2: 100%    Lab orders placed from triage: ua and pregnancy test

## 2018-10-18 NOTE — MAU Provider Note (Signed)
Chief Complaint: Vaginal Bleeding and Abdominal Pain   First Provider Initiated Contact with Patient 10/18/18 1125     SUBJECTIVE HPI: Mia James is a 22 y.o. G4P3003 at [redacted]w[redacted]d who presents to [facility] Admissions reporting vaginal bleeding and abdominal cramping. Symptoms started yesterday. Has noticed red spotting on toilet paper this morning. Not wearing a pad and no passing clots. Denies fever/chills, dysuria, vaginal discharge, or recent intercourse.   Location: lower abdomen Quality: cramping Severity: 4/10 on pain scale Duration: 1 day Timing: intermittent Modifying factors: none Associated signs and symptoms: vaginal bleeding  Past Medical History:  Diagnosis Date  . Anemia   . Eczema   . Infection    UTI   OB History  Gravida Para Term Preterm AB Living  4 3 3  0 0 3  SAB TAB Ectopic Multiple Live Births  0 0 0 0 3    # Outcome Date GA Lbr Len/2nd Weight Sex Delivery Anes PTL Lv  4 Current           3 Term 07/12/15 [redacted]w[redacted]d 10:04 / 00:08 3515 g F Vag-Spont None  LIV  2 Term 2015   3572 g M Vag-Spont EPI N LIV  1 Term      Vag-Spont      Past Surgical History:  Procedure Laterality Date  . NO PAST SURGERIES     Social History   Socioeconomic History  . Marital status: Married    Spouse name: Not on file  . Number of children: Not on file  . Years of education: Not on file  . Highest education level: Not on file  Occupational History  . Not on file  Social Needs  . Financial resource strain: Not on file  . Food insecurity:    Worry: Not on file    Inability: Not on file  . Transportation needs:    Medical: Not on file    Non-medical: Not on file  Tobacco Use  . Smoking status: Never Smoker  . Smokeless tobacco: Never Used  Substance and Sexual Activity  . Alcohol use: No  . Drug use: No  . Sexual activity: Not Currently    Birth control/protection: None  Lifestyle  . Physical activity:    Days per week: Not on file    Minutes per session:  Not on file  . Stress: Not on file  Relationships  . Social connections:    Talks on phone: Not on file    Gets together: Not on file    Attends religious service: Not on file    Active member of club or organization: Not on file    Attends meetings of clubs or organizations: Not on file    Relationship status: Not on file  . Intimate partner violence:    Fear of current or ex partner: Not on file    Emotionally abused: Not on file    Physically abused: Not on file    Forced sexual activity: Not on file  Other Topics Concern  . Not on file  Social History Narrative  . Not on file   Family History  Problem Relation Age of Onset  . Heart disease Maternal Grandmother   . Diabetes Maternal Grandfather    No current facility-administered medications on file prior to encounter.    No current outpatient medications on file prior to encounter.   No Known Allergies  I have reviewed patient's Past Medical Hx, Surgical Hx, Family Hx, Social Hx, medications and allergies.  Review of Systems  Constitutional: Negative.   Gastrointestinal: Positive for abdominal pain and nausea. Negative for constipation, diarrhea and vomiting.  Genitourinary: Positive for vaginal bleeding. Negative for dysuria and vaginal discharge.    OBJECTIVE Patient Vitals for the past 24 hrs:  BP Temp Temp src Pulse Resp SpO2 Weight  10/18/18 1357 105/68 - - 79 18 - -  10/18/18 1107 103/67 98.1 F (36.7 C) Oral 80 16 100 % 57.2 kg   Constitutional: Well-developed, well-nourished female in no acute distress.  Cardiovascular: normal rate & rhythm, no murmur Respiratory: normal rate and effort. Lung sounds clear throughout GI: Abd soft, non-tender, Pos BS x 4. No guarding or rebound tenderness MS: Extremities nontender, no edema, normal ROM Neurologic: Alert and oriented x 4.  GU:     SPECULUM EXAM: NEFG, physiologic discharge, no blood noted, cervix pink & smooth. Some old brown blood.   BIMANUAL: No CMT.  cervix closed; uterus normal size, no adnexal tenderness or masses.    LAB RESULTS Results for orders placed or performed during the hospital encounter of 10/18/18 (from the past 24 hour(s))  Urinalysis, Routine w reflex microscopic     Status: Abnormal   Collection Time: 10/18/18 11:10 AM  Result Value Ref Range   Color, Urine YELLOW YELLOW   APPearance HAZY (A) CLEAR   Specific Gravity, Urine 1.023 1.005 - 1.030   pH 6.0 5.0 - 8.0   Glucose, UA NEGATIVE NEGATIVE mg/dL   Hgb urine dipstick NEGATIVE NEGATIVE   Bilirubin Urine NEGATIVE NEGATIVE   Ketones, ur NEGATIVE NEGATIVE mg/dL   Protein, ur NEGATIVE NEGATIVE mg/dL   Nitrite NEGATIVE NEGATIVE   Leukocytes, UA NEGATIVE NEGATIVE  Pregnancy, urine POC     Status: Abnormal   Collection Time: 10/18/18 11:12 AM  Result Value Ref Range   Preg Test, Ur POSITIVE (A) NEGATIVE  Wet prep, genital     Status: Abnormal   Collection Time: 10/18/18 11:35 AM  Result Value Ref Range   Yeast Wet Prep HPF POC NONE SEEN NONE SEEN   Trich, Wet Prep NONE SEEN NONE SEEN   Clue Cells Wet Prep HPF POC NONE SEEN NONE SEEN   WBC, Wet Prep HPF POC MANY (A) NONE SEEN   Sperm NONE SEEN   CBC     Status: Abnormal   Collection Time: 10/18/18 11:52 AM  Result Value Ref Range   WBC 5.6 4.0 - 10.5 K/uL   RBC 4.21 3.87 - 5.11 MIL/uL   Hemoglobin 10.7 (L) 12.0 - 15.0 g/dL   HCT 16.133.4 (L) 09.636.0 - 04.546.0 %   MCV 79.3 (L) 80.0 - 100.0 fL   MCH 25.4 (L) 26.0 - 34.0 pg   MCHC 32.0 30.0 - 36.0 g/dL   RDW 40.913.5 81.111.5 - 91.415.5 %   Platelets 256 150 - 400 K/uL   nRBC 0.0 0.0 - 0.2 %  hCG, quantitative, pregnancy     Status: Abnormal   Collection Time: 10/18/18 11:52 AM  Result Value Ref Range   hCG, Beta Chain, Quant, S 66,858 (H) <5 mIU/mL    IMAGING [place] Ob Less Than 14 Weeks With Ob Transvaginal  Result Date: 10/18/2018 CLINICAL DATA:  22 year old pregnant female presents with abdominal pain and vaginal bleeding. Quantitative beta HCG [id number]. EDC by LMP:  06/08/2019, projecting to an expected gestational age of [redacted] weeks 5 days EXAM: OBSTETRIC <14 WK US AND TRANSVAGINAL OB US TECHNIQUE: Both transabdominal and transvaginal ultrasound examinations were performed for complete evaluation of the gestation  as well as the maternal uterus, adnexal regions, and pelvic cul-de-sac. Transvaginal technique was performed to assess early pregnancy. COMPARISON:  No prior scans from this gestation. FINDINGS: Intrauterine gestational sac: Single intrauterine gestational sac appears normal in size, shape and position. Yolk sac:  Visualized. Embryo:  Visualized. Cardiac Activity: Visualized. Heart Rate: 135 bpm CRL:  6.1 mm   6 w   3 d                  [place] EDC: 06/10/2019 Subchorionic hemorrhage: Moderate perigestational bleed in the lower cavity involving approximately 50% of the gestational sac circumference. Maternal uterus/adnexae: Anteverted uterus with no uterine fibroids. Left ovary measures 3.7 x 1.7 x 2.1 cm. Right ovary measures 5.2 x 3.4 x 3.1 cm and contains a corpus luteum. No abnormal ovarian or adnexal masses. No abnormal free fluid in the pelvis. IMPRESSION: 1. Single living intrauterine gestation at 6 weeks 3 days by crown-rump length, concordant with provided menstrual dating. 2. Moderate perigestational bleed. Normal embryonic cardiac activity. 3. No ovarian or adnexal abnormality. Electronically Signed   By: Delbert Phenix M.D.   On: 10/18/2018 13:32    MAU COURSE Orders Placed This Encounter  Procedures  . Wet prep, genital  . US OB LESS THAN 14 WEEKS WITH OB TRANSVAGINAL  . Urinalysis, Routine w reflex microscopic  . CBC  . hCG, quantitative, pregnancy  . Pregnancy, urine POC  . Discharge patient   No orders of the defined types were placed in this encounter.   MDM +UPT UA, wet prep, GC/chlamydia, CBC, ABO/Rh, quant hCG, and [place] today to rule out ectopic pregnancy  Ultrasound shows live IUP & moderate SCH Rh positive  ASSESSMENT 1. Normal IUP  (intrauterine pregnancy) on prenatal ultrasound, first trimester   2. Vaginal bleeding in pregnancy, first trimester   3. Abdominal pain during pregnancy in first trimester   4. Subchorionic hematoma in first trimester, single or unspecified fetus     PLAN Discharge home in stable condition. Bleeding & SAB precautions Start prenatal care GC/CT pending  Allergies as of 10/18/2018   No Known Allergies     Medication List    STOP taking these medications   cephALEXin 500 MG capsule Commonly known as:  Lethea Killings, NP 10/18/2018  7:46 PM

## 2018-10-20 LAB — GC/CHLAMYDIA PROBE AMP (~~LOC~~) NOT AT ARMC
Chlamydia: NEGATIVE
NEISSERIA GONORRHEA: NEGATIVE

## 2018-12-13 ENCOUNTER — Encounter (HOSPITAL_COMMUNITY): Payer: Self-pay | Admitting: *Deleted

## 2018-12-13 ENCOUNTER — Other Ambulatory Visit: Payer: Self-pay

## 2018-12-13 ENCOUNTER — Inpatient Hospital Stay (HOSPITAL_COMMUNITY)
Admission: AD | Admit: 2018-12-13 | Discharge: 2018-12-13 | Disposition: A | Discharge status: Home / Self Care | Payer: Federal, State, Local not specified - PPO | Source: Ambulatory Visit | Attending: Obstetrics and Gynecology | Admitting: Obstetrics and Gynecology

## 2018-12-13 ENCOUNTER — Inpatient Hospital Stay (HOSPITAL_BASED_OUTPATIENT_CLINIC_OR_DEPARTMENT_OTHER): Payer: Federal, State, Local not specified - PPO

## 2018-12-13 DIAGNOSIS — O209 Hemorrhage in early pregnancy, unspecified: Secondary | ICD-10-CM | POA: Diagnosis present

## 2018-12-13 DIAGNOSIS — R109 Unspecified abdominal pain: Secondary | ICD-10-CM

## 2018-12-13 DIAGNOSIS — Z3A14 14 weeks gestation of pregnancy: Secondary | ICD-10-CM | POA: Insufficient documentation

## 2018-12-13 DIAGNOSIS — O26892 Other specified pregnancy related conditions, second trimester: Secondary | ICD-10-CM

## 2018-12-13 DIAGNOSIS — O021 Missed abortion: Secondary | ICD-10-CM | POA: Diagnosis not present

## 2018-12-13 DIAGNOSIS — O4692 Antepartum hemorrhage, unspecified, second trimester: Secondary | ICD-10-CM

## 2018-12-13 MED ORDER — TRAMADOL HCL 50 MG PO TABS: 50.0000 mg | ORAL_TABLET | Freq: Four times a day (QID) | ORAL | 0 refills | Status: AC | PRN

## 2018-12-13 NOTE — Discharge Instructions (Signed)
You can call Rolan BuccoJacinda Battle at [phone] to schedule your D&E. She will be in her office on Monday 12/15/2018 after 8 am.

## 2018-12-13 NOTE — MAU Provider Note (Signed)
History     CSN: [id number]  Arrival date and time: 12/13/18 8315   First Provider Initiated Contact with Patient 12/13/18 0201      Chief Complaint  Patient presents with  . Vaginal Bleeding  . Abdominal Pain   HPI  Ms.  Mia James is a 23 y.o. year old G50P3003 female at [redacted]w[redacted]d weeks gestation who presents to MAU reporting reddish brown blood that was "running down her leg" around 0120. The patient wore a pad into the hospital that has some blood on it. She also reports abdominal pain/cramping; rated 3 or 4/10. She was told before that she has a [facility] and that the bleeding was normal at that time. She thought that it was only going to happen that one time. She receives [facility] with [employer] in [place], [place].    Past Medical History:  Diagnosis Date  . Anemia   . Eczema   . Infection    UTI    Past Surgical History:  Procedure Laterality Date  . NO PAST SURGERIES      Family History  Problem Relation Age of Onset  . Heart disease Maternal Grandmother   . Diabetes Maternal Grandfather     Social History   Tobacco Use  . Smoking status: Never Smoker  . Smokeless tobacco: Never Used  Substance Use Topics  . Alcohol use: No  . Drug use: No    Allergies: No Known Allergies  Medications Prior to Admission  Medication Sig Dispense Refill Last Dose  . Prenatal Vit-Fe Fumarate-FA (MULTIVITAMIN-PRENATAL) 27-0.8 MG TABS tablet Take 1 tablet by mouth daily at 12 noon.   Past Week at Unknown time    Review of Systems  Constitutional: Negative.   HENT: Negative.   Eyes: Negative.   Respiratory: Negative.   Cardiovascular: Negative.   Gastrointestinal: Negative.   Endocrine: Negative.   Genitourinary: Positive for pelvic pain (cramping) and vaginal bleeding.  Musculoskeletal: Negative.   Skin: Negative.   Allergic/Immunologic: Negative.   Neurological: Negative.   Hematological: Negative.   Psychiatric/Behavioral: Negative.    Physical Exam    Blood pressure 116/80, pulse 93, temperature 98.5 F (36.9 C), temperature source Oral, resp. rate 17, weight 57.2 kg, last menstrual period 08/31/2018, currently breastfeeding.  Physical Exam  Constitutional: She is oriented to person, place, and time. She appears well-developed and well-nourished.  HENT:  Head: Normocephalic and atraumatic.  Eyes: Pupils are equal, round, and reactive to light.  Neck: Normal range of motion.  Cardiovascular: Normal rate.  Respiratory: Effort normal.  GI: Soft.  Genitourinary:    Genitourinary Comments: Uterus: non-tender, SE: cervix is smooth, pink, no lesions, scant amt of tannish-pink blood   Musculoskeletal: Normal range of motion.  Neurological: She is alert and oriented to person, place, and time. She has normal reflexes.  Skin: Skin is warm and dry.  Psychiatric: She has a normal mood and affect. Her behavior is normal. Judgment and thought content normal.    MAU Course  Procedures  MDM FHTs by doppler: unable to auscultate with doppler OB Limited U/S -- (preliminary report) No cardiac activity  *Consult with Dr. Jolayne Panther @ [phone] - notified of patient's complaints, assessments, lab & U/S results, recommended tx plan offer expectant management or offer D&E to be scheduled for next week - ok to d/c home  Assessment and Plan  Fetal demise before 20 weeks with retention of dead fetus  - Patient chooses expectant management so she can talk with her husband -  Patient plans to call Rolan Bucco Monday 12/15/2018, if she decides to have the D&E -- phone number given - Comfort information given by RN - Information provided on incomplete miscarriage   Abdominal pain during pregnancy in second trimester  - Rx for Ultram 50 mg every 6 hrs prn pain   Raelyn Mora, MSN, CNM 12/13/2018, 2:11 AM

## 2018-12-13 NOTE — MAU Note (Signed)
Pt reports to MAU c/o brownish reddish blood that was running down her leg pt states this started around 0120. Pt currently has a pad on with some blood. Pt reports cramping that is a 3 or 4 on the pain scale.

## 2018-12-18 ENCOUNTER — Other Ambulatory Visit: Payer: Self-pay

## 2018-12-18 ENCOUNTER — Encounter (HOSPITAL_COMMUNITY): Payer: Self-pay | Admitting: *Deleted

## 2018-12-18 ENCOUNTER — Inpatient Hospital Stay (HOSPITAL_COMMUNITY)
Admission: AD | Admit: 2018-12-18 | Discharge: 2018-12-18 | Disposition: A | Discharge status: Home / Self Care | Payer: Federal, State, Local not specified - PPO | Attending: Obstetrics and Gynecology | Admitting: Obstetrics and Gynecology

## 2018-12-18 DIAGNOSIS — O039 Complete or unspecified spontaneous abortion without complication: Secondary | ICD-10-CM

## 2018-12-18 DIAGNOSIS — O021 Missed abortion: Secondary | ICD-10-CM

## 2018-12-18 DIAGNOSIS — Z6790 Unspecified blood type, Rh positive: Secondary | ICD-10-CM

## 2018-12-18 LAB — CBC
HCT: 31.4 % — ABNORMAL LOW (ref 36.0–46.0)
Hemoglobin: 9.9 g/dL — ABNORMAL LOW (ref 12.0–15.0)
MCH: 25 pg — ABNORMAL LOW (ref 26.0–34.0)
MCHC: 31.5 g/dL (ref 30.0–36.0)
MCV: 79.3 fL — ABNORMAL LOW (ref 80.0–100.0)
Platelets: 248 10*3/uL (ref 150–400)
RBC: 3.96 MIL/uL (ref 3.87–5.11)
RDW: 14.2 % (ref 11.5–15.5)
WBC: 7.8 10*3/uL (ref 4.0–10.5)
nRBC: 0 % (ref 0.0–0.2)

## 2018-12-18 MED ORDER — IBUPROFEN 600 MG PO TABS
600.0000 mg | ORAL_TABLET | Freq: Four times a day (QID) | ORAL | Status: DC | PRN
  Administered 2018-12-18: 600 mg via ORAL
  Filled 2018-12-18: qty 1

## 2018-12-18 NOTE — MAU Note (Signed)
Presents with c/o VB and cramping.  Reports had miscarriage, delivered fetus @ 0100 this morning.  States having moderate VB with clots.  Took Oxycodone 1 tablet @ 2300 for pain.

## 2018-12-18 NOTE — MAU Provider Note (Signed)
History     CSN: [id number]  Arrival date and time: 12/18/18 [id number]   First Provider Initiated Contact with Patient 12/18/18 [phone]      Chief Complaint  Patient presents with  . Vaginal Bleeding  . Cramping   23 y.o. 374P3003 female with known 14 wk IUFD presenting after delivery at home. Reports delivery of fetus around 1am today. Placenta has not delivered. Reports moderate amt of VB and passed 1 clot. Having uterine cramping, intermittent, rates 9/10. She took Percocet around 11pm.    OB History    Gravida  4   Para  3   Term  3   Preterm  0   AB  0   Living  3     SAB  0   TAB  0   Ectopic  0   Multiple  0   Live Births  3           Past Medical History:  Diagnosis Date  . Anemia   . Eczema   . Infection    UTI    Past Surgical History:  Procedure Laterality Date  . NO PAST SURGERIES      Family History  Problem Relation Age of Onset  . Healthy Mother   . Heart disease Maternal Grandmother   . Diabetes Maternal Grandfather     Social History   Tobacco Use  . Smoking status: Never Smoker  . Smokeless tobacco: Never Used  Substance Use Topics  . Alcohol use: No  . Drug use: No    Allergies: No Known Allergies  Medications Prior to Admission  Medication Sig Dispense Refill Last Dose  . Prenatal Vit-Fe Fumarate-FA (MULTIVITAMIN-PRENATAL) 27-0.8 MG TABS tablet Take 1 tablet by mouth daily at 12 noon.   Past Week at Unknown time  . traMADol (ULTRAM) 50 MG tablet Take 1 tablet (50 mg total) by mouth every 6 (six) hours as needed. 20 tablet 0     Review of Systems  Constitutional: Negative for chills and fever.  Gastrointestinal: Positive for abdominal pain.  Genitourinary: Positive for vaginal bleeding.   Physical Exam   Blood pressure 109/79, pulse (!) 104, temperature 97.8 F (36.6 C), temperature source Oral, resp. rate 18, height 5\' 5"  (1.651 m), weight 56.7 kg, last menstrual period 08/31/2018, SpO2 100 %, currently  breastfeeding.  Physical Exam  Nursing note and vitals reviewed. Constitutional: She is oriented to person, place, and time. She appears well-developed and well-nourished. No distress.  HENT:  Head: Normocephalic and atraumatic.  Neck: Normal range of motion.  Respiratory: Effort normal. No respiratory distress.  Genitourinary:    Genitourinary Comments: Speculum: placenta in vault, teased out intact (1 piece) with ring forcep, cervix visually 1cm, bleeding small   Musculoskeletal: Normal range of motion.  Neurological: She is alert and oriented to person, place, and time.  Skin: Skin is warm and dry.  Psychiatric: She has a normal mood and affect.   Results for orders placed or performed during the hospital encounter of 12/18/18 (from the past 24 hour(s))  CBC     Status: Abnormal   Collection Time: 12/18/18  8:19 AM  Result Value Ref Range   WBC 7.8 4.0 - 10.5 K/uL   RBC 3.96 3.87 - 5.11 MIL/uL   Hemoglobin 9.9 (L) 12.0 - 15.0 g/dL   HCT 30.831.4 (L) 65.736.0 - 84.646.0 %   MCV 79.3 (L) 80.0 - 100.0 fL   MCH 25.0 (L) 26.0 - 34.0 pg   MCHC  31.5 30.0 - 36.0 g/dL   RDW 83.3 82.5 - 05.3 %   Platelets 248 150 - 400 K/uL   nRBC 0.0 0.0 - 0.2 %   MAU Course  Procedures Orders Placed This Encounter  Procedures  . Urinalysis, Routine w reflex microscopic    Standing Status:   Standing    Number of Occurrences:   1  . CBC    Standing Status:   Standing    Number of Occurrences:   1   Meds ordered this encounter  Medications  . ibuprofen (ADVIL,MOTRIN) tablet 600 mg   MDM Labs ordered and reviewed. Placenta to path. No pain since placenta removed. Bleeding scant. Stable for discharge home.   Assessment and Plan  SAB Rh positive  Discharge home Follow up at [facility] in 2 weeks as scheduled Bleeding/return precautions Ibuprofen prn  Allergies as of 12/18/2018   No Known Allergies     Medication List    TAKE these medications   multivitamin-prenatal 27-0.8 MG Tabs tablet Take 1  tablet by mouth daily at 12 noon.   traMADol 50 MG tablet Commonly known as:  ULTRAM Take 1 tablet (50 mg total) by mouth every 6 (six) hours as needed.      Donette Larry, CNM 12/18/2018, 8:38 AM

## 2018-12-18 NOTE — Discharge Instructions (Signed)
Miscarriage  A miscarriage is the loss of an unborn baby (fetus) before the 20th week of pregnancy. Most miscarriages happen during the first 3 months of pregnancy. Sometimes, a miscarriage can happen before a woman knows that she is pregnant.  Having a miscarriage can be an emotional experience. If you have had a miscarriage, talk with your health care provider about any questions you may have about miscarrying, the grieving process, and your plans for future pregnancy.  What are the causes?  A miscarriage may be caused by:  · Problems with the genes or chromosomes of the fetus. These problems make it impossible for the baby to develop normally. They are often the result of random errors that occur early in the development of the baby, and are not passed from parent to child (not inherited).  · Infection of the cervix or uterus.  · Conditions that affect hormone balance in the body.  · Problems with the cervix, such as the cervix opening and thinning before pregnancy is at term (cervical insufficiency).  · Problems with the uterus. These may include:  ? A uterus with an abnormal shape.  ? Fibroids in the uterus.  ? Congenital abnormalities. These are problems that were present at birth.  · Certain medical conditions.  · Smoking, drinking alcohol, or using drugs.  · Injury (trauma).  In many cases, the cause of a miscarriage is not known.  What are the signs or symptoms?  Symptoms of this condition include:  · Vaginal bleeding or spotting, with or without cramps or pain.  · Pain or cramping in the abdomen or lower back.  · Passing fluid, tissue, or blood clots from the vagina.  How is this diagnosed?  This condition may be diagnosed based on:  · A physical exam.  · Ultrasound.  · Blood tests.  · Urine tests.  How is this treated?  Treatment for a miscarriage is sometimes not necessary if you naturally pass all the tissue that was in your uterus. If necessary, this condition may be treated with:  · Dilation and  curettage (D&C). This is a procedure in which the cervix is stretched open and the lining of the uterus (endometrium) is scraped. This is done only if tissue from the fetus or placenta remains in the body (incomplete miscarriage).  · Medicines, such as:  ? Antibiotic medicine, to treat infection.  ? Medicine to help the body pass any remaining tissue.  ? Medicine to reduce (contract) the size of the uterus. These medicines may be given if you have a lot of bleeding.  If you have Rh negative blood and your baby was Rh positive, you will need a shot of a medicine called Rh immunoglobulinto protect your future babies from Rh blood problems. "Rh-negative" and "Rh-positive" refer to whether or not the blood has a specific protein found on the surface of red blood cells (Rh factor).  Follow these instructions at home:  Medicines    · Take over-the-counter and prescription medicines only as told by your health care provider.  · If you were prescribed antibiotic medicine, take it as told by your health care provider. Do not stop taking the antibiotic even if you start to feel better.  · Do not take NSAIDs, such as aspirin and ibuprofen, unless they are approved by your health care provider. These medicines can cause bleeding.  Activity  · Rest as directed. Ask your health care provider what activities are safe for you.  · Have someone   help with home and family responsibilities during this time.  General instructions  · Keep track of the number of sanitary pads you use each day and how soaked (saturated) they are. Write down this information.  · Monitor the amount of tissue or blood clots that you pass from your vagina. Save any large amounts of tissue for your health care provider to examine.  · Do not use tampons, douche, or have sex until your health care provider approves.  · To help you and your partner with the process of grieving, talk with your health care provider or seek counseling.  · When you are ready, meet with  your health care provider to discuss any important steps you should take for your health. Also, discuss steps you should take to have a healthy pregnancy in the future.  · Keep all follow-up visits as told by your health care provider. This is important.  Where to find more information  · The American Congress of Obstetricians and Gynecologists: www.acog.org  · U.[place]. Department of Health and Human Services Office of Women’s Health: www.womenshealth.gov  Contact a health care provider if:  · You have a fever or chills.  · You have a foul smelling vaginal discharge.  · You have more bleeding instead of less.  Get help right away if:  · You have severe cramps or pain in your back or abdomen.  · You pass blood clots or tissue from your vagina that is walnut-sized or larger.  · You soak more than 1 regular sanitary pad in an hour.  · You become light-headed or weak.  · You pass out.  · You have feelings of sadness that take over your thoughts, or you have thoughts of hurting yourself.  Summary  · Most miscarriages happen in the first 3 months of pregnancy. Sometimes miscarriage happens before a woman even knows that she is pregnant.  · Follow your health care provider's instruction for home care. Keep all follow-up appointments.  · To help you and your partner with the process of grieving, talk with your health care provider or seek counseling.  This information is not intended to replace advice given to you by your health care provider. Make sure you discuss any questions you have with your health care provider.  Document Released: 05/01/2001 Document Revised: 12/11/2016 Document Reviewed: 12/11/2016  [employer]vier Interactive Patient Education © 2019 [employer]

## 2019-05-10 IMAGING — US US OB < 14 WEEKS - US OB TV
1 series · 15 of 28 positions shown · non-contrast
Comparison: No prior scans from this gestation.

CLINICAL DATA: 22-year-old pregnant female presents with abdominal
pain and vaginal bleeding. Quantitative beta HCG [DATE].

EDC by LMP: 06/08/2019, projecting to an expected gestational age of
6 weeks 5 days
EXAM:
OBSTETRIC <14 WK US AND TRANSVAGINAL OB US
TECHNIQUE: Both transabdominal and transvaginal ultrasound examinations were
performed for complete evaluation of the gestation as well as the
maternal uterus, adnexal regions, and pelvic cul-de-sac.
Transvaginal technique was performed to assess early pregnancy.

[Series 1: us ob < 14 weeks - us ob tv · 15 of 74 slices shown]
[im 1/74]
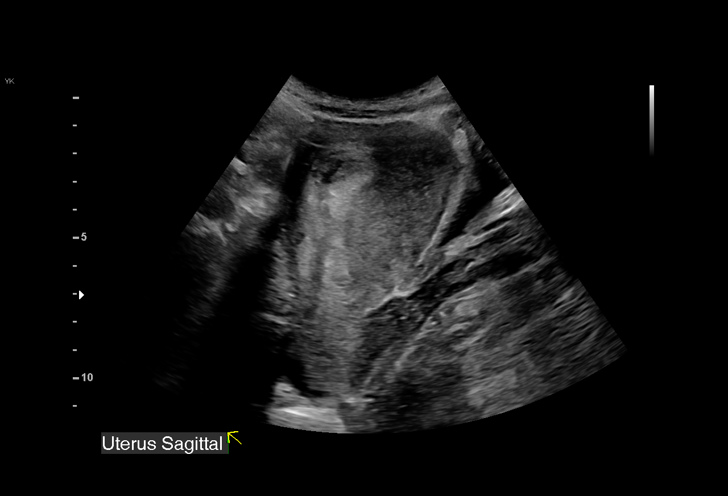
[im 6/74]
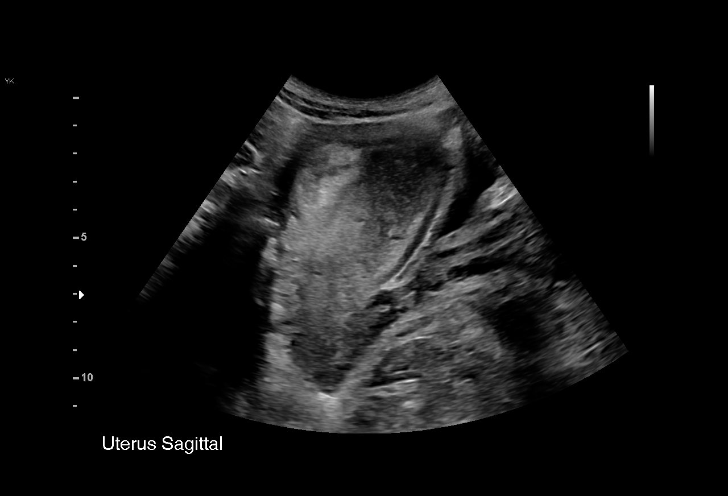
[im 11/74]
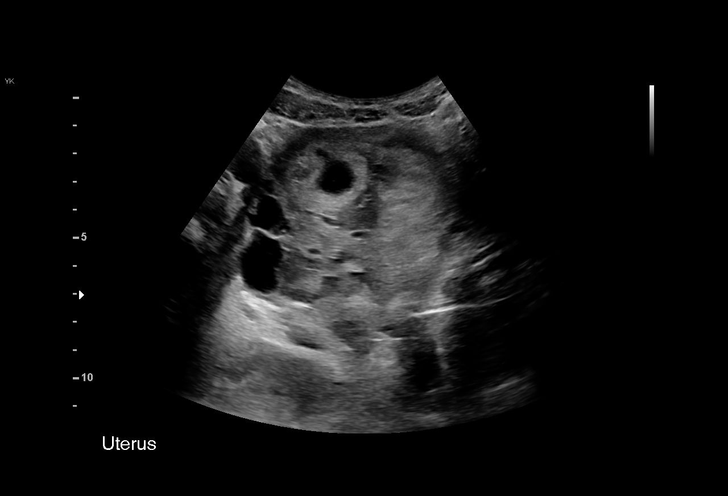
[im 17/74]
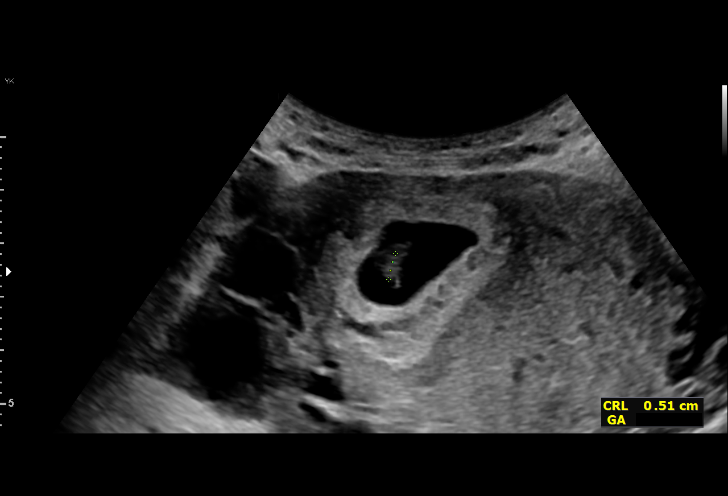
[im 22/74]
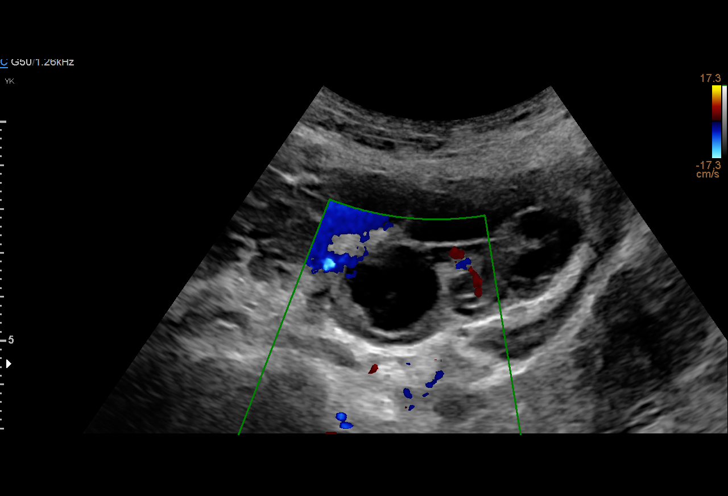
[im 28/74]
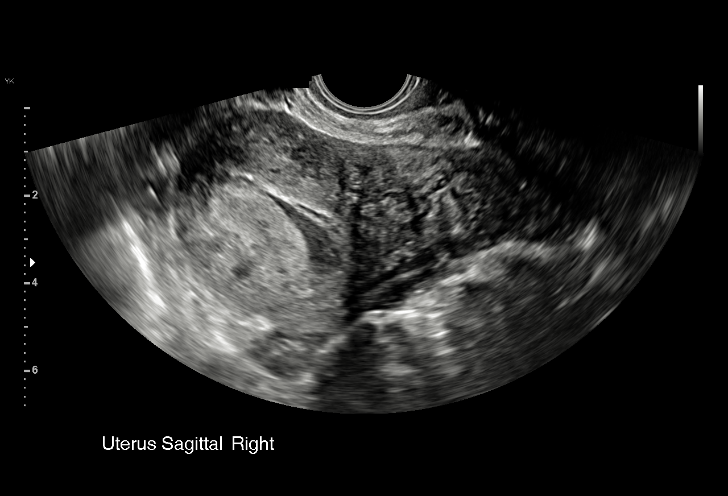
[im 33/74]
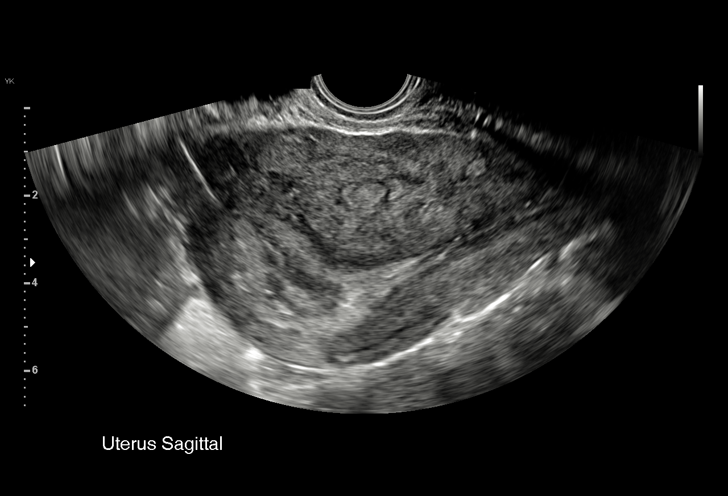
[im 38/74]
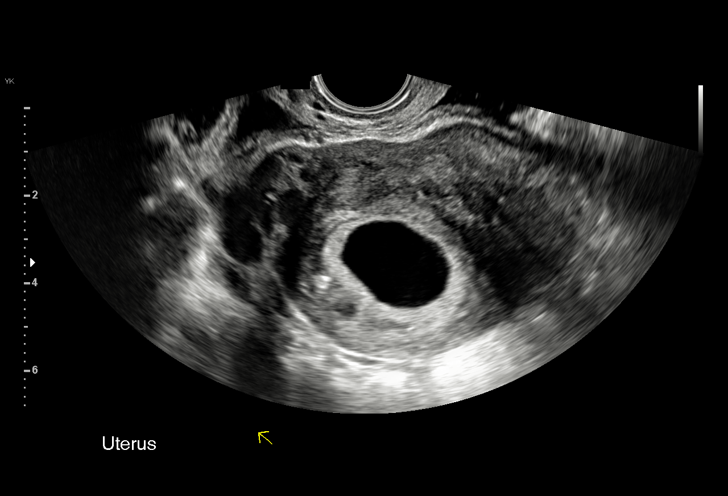
[im 41/74]
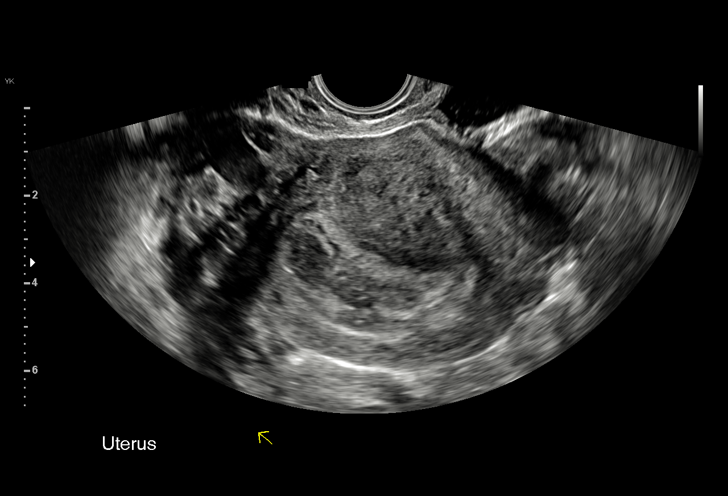
[im 46/74]
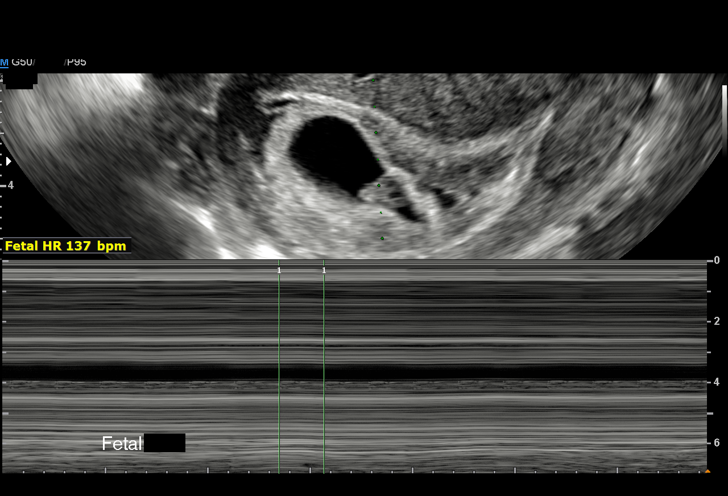
[im 52/74]
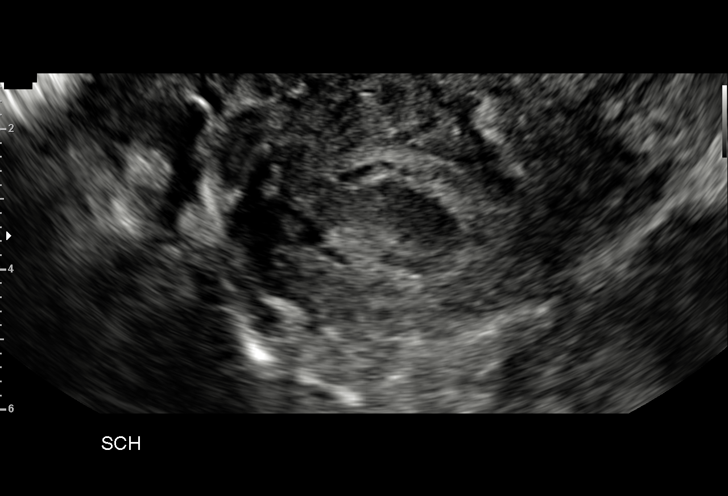
[im 57/74]
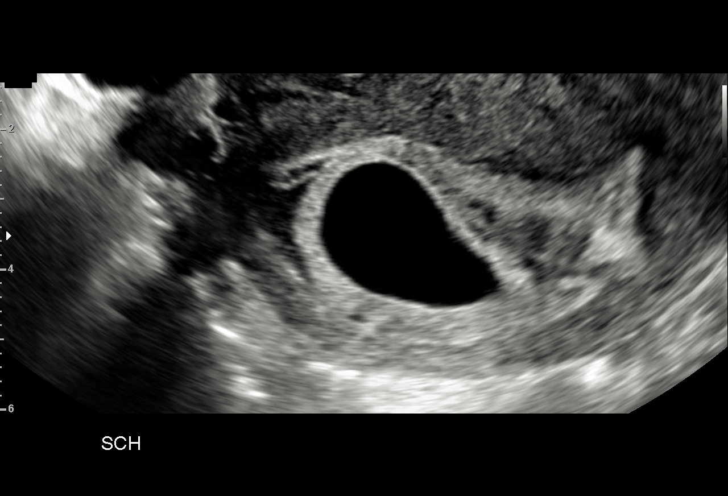
[im 63/74]
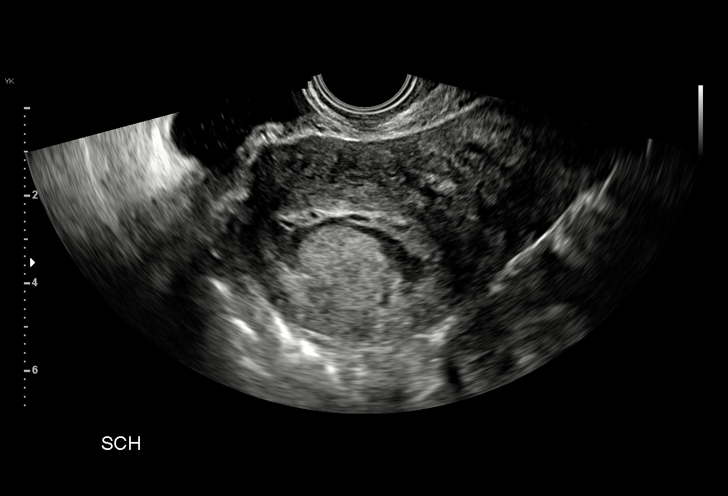
[im 68/74]
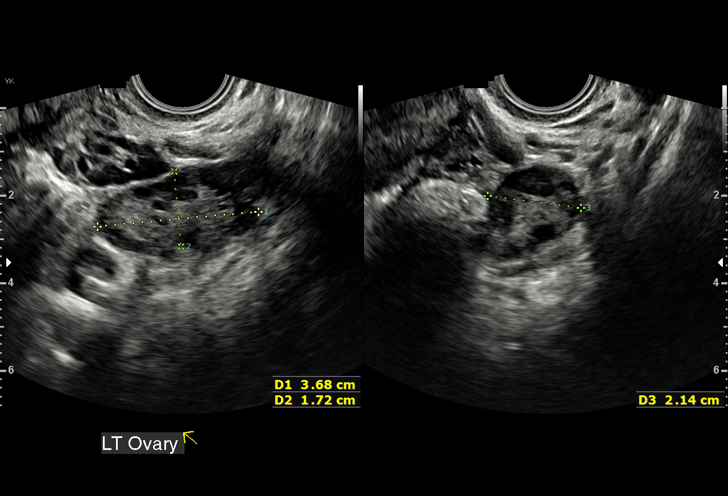
[im 74/74]
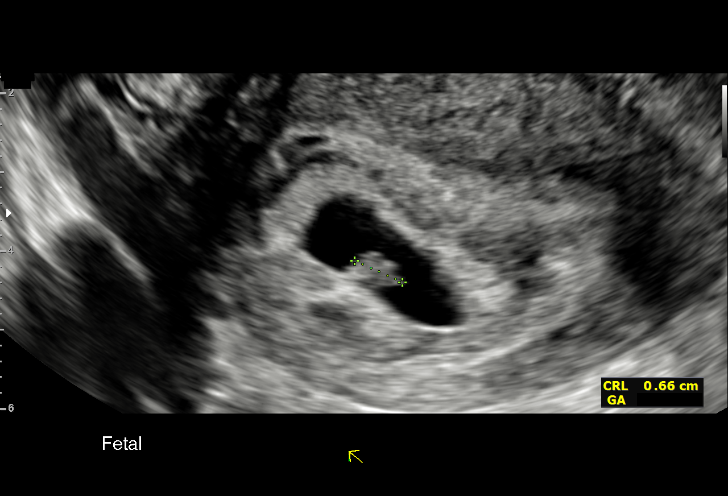

[15 of 28 positions shown; findings below may reference images not displayed]

FINDINGS: Intrauterine gestational sac: Single intrauterine gestational sac
appears normal in size, shape and position.

Yolk sac:  Visualized.

Embryo:  Visualized.

Cardiac Activity: Visualized.

Heart Rate: 135 bpm

CRL:  6.1 mm   6 w   3 d                  US EDC: 06/10/2019

Subchorionic hemorrhage: Moderate perigestational bleed in the lower
cavity involving approximately 50% of the gestational sac
circumference.

Maternal uterus/adnexae: Anteverted uterus with no uterine fibroids.
Left ovary measures 3.7 x 1.7 x 2.1 cm. Right ovary measures 5.2 x
3.4 x 3.1 cm and contains a corpus luteum. No abnormal ovarian or
adnexal masses. No abnormal free fluid in the pelvis.
IMPRESSION: 1. Single living intrauterine gestation at 6 weeks 3 days by
crown-rump length, concordant with provided menstrual dating.
2. Moderate perigestational bleed. Normal embryonic cardiac
activity.
3. No ovarian or adnexal abnormality.

## 2019-08-30 ENCOUNTER — Encounter (HOSPITAL_COMMUNITY): Payer: Self-pay

## 2021-04-30 ENCOUNTER — Encounter (HOSPITAL_BASED_OUTPATIENT_CLINIC_OR_DEPARTMENT_OTHER): Payer: Self-pay | Admitting: Urology

## 2021-04-30 ENCOUNTER — Emergency Department (HOSPITAL_BASED_OUTPATIENT_CLINIC_OR_DEPARTMENT_OTHER)
Admission: EM | Admit: 2021-04-30 | Discharge: 2021-04-30 | Disposition: A | Discharge status: Home / Self Care | Payer: Federal, State, Local not specified - PPO | Attending: Emergency Medicine | Admitting: Emergency Medicine

## 2021-04-30 DIAGNOSIS — H1031 Unspecified acute conjunctivitis, right eye: Secondary | ICD-10-CM

## 2021-04-30 DIAGNOSIS — H6091 Unspecified otitis externa, right ear: Secondary | ICD-10-CM | POA: Insufficient documentation

## 2021-04-30 DIAGNOSIS — H60501 Unspecified acute noninfective otitis externa, right ear: Secondary | ICD-10-CM

## 2021-04-30 DIAGNOSIS — H5789 Other specified disorders of eye and adnexa: Secondary | ICD-10-CM | POA: Diagnosis present

## 2021-04-30 MED ORDER — TOBRAMYCIN 0.3 % OP SOLN
2.0000 [drp] | Freq: Once | OPHTHALMIC | Status: AC
  Administered 2021-04-30: 2 [drp] via OPHTHALMIC
  Filled 2021-04-30: qty 5

## 2021-04-30 MED ORDER — CIPROFLOXACIN-DEXAMETHASONE 0.3-0.1 % OT SUSP: 4.0000 [drp] | Freq: Two times a day (BID) | OTIC | 0 refills | Status: AC

## 2021-04-30 NOTE — Discharge Instructions (Addendum)
It was our pleasure to provide your ER care today - we hope that you feel better.  Use tobrex eye drops 1-2 drops in right eye 4-5x/day for the next 5 days.   Use cipro ear drops as prescribed.   Take acetaminophen or ibuprofen as need.   Follow up with primary care doctor in one week if symptoms fail to improve/resolve.  Return to ER if worse, new symptoms, fevers, severe or intractable pain, change in vision, severe headache, or other concern.

## 2021-04-30 NOTE — ED Provider Notes (Signed)
[facility] EMERGENCY DEPT Provider Note   CSN: [id number] Arrival date & time: 04/30/21  1818     History Chief Complaint  Patient presents with   Eye Problem   Ear Drainage    Mia James is a 25 y.o. female.  Patient c/o right eye redness and matting/drainage in the past two days. Symptoms acute onset, moderate, constant, persistent. No injury or chemical exposure to eye. No contact use. Denies severe eye pain or change in vision. No fever or chills. No headache. No specific known ill contacts. No recent abx use. Pt also notes mild-mod soreness and drainage to right ear. No hearing loss. No trauma to ear. No sore throat. No cough or sob. No nv.   The history is provided by the patient.  Eye Problem Associated symptoms: discharge and redness   Associated symptoms: no headaches, no nausea and no vomiting   Ear Drainage Pertinent negatives include no headaches and no shortness of breath.      Past Medical History:  Diagnosis Date   Anemia    Eczema    Infection    UTI    Patient Active Problem List   Diagnosis Date Noted   Fetal demise before 20 weeks with retention of dead fetus 05-Jan-2019    Past Surgical History:  Procedure Laterality Date   NO PAST SURGERIES       OB History     Gravida  4   Para  3   Term  3   Preterm  0   AB  0   Living  3      SAB  0   IAB  0   Ectopic  0   Multiple  0   Live Births  3           Family History  Problem Relation Age of Onset   Healthy Mother    Heart disease Maternal Grandmother    Diabetes Maternal Grandfather     Social History   Tobacco Use   Smoking status: Never   Smokeless tobacco: Never  Vaping Use   Vaping Use: Never used  Substance Use Topics   Alcohol use: No   Drug use: No    Home Medications Prior to Admission medications   Medication Sig Start Date End Date Taking? Authorizing Provider  Prenatal Vit-Fe Fumarate-FA (MULTIVITAMIN-PRENATAL) 27-0.8  MG TABS tablet Take 1 tablet by mouth daily at 12 noon.    [provider]    Allergies    Patient has no known allergies.  Review of Systems   Review of Systems  Constitutional:  Negative for chills and fever.  HENT:  Positive for ear pain. Negative for hearing loss and sore throat.   Eyes:  Positive for discharge and redness.  Respiratory:  Negative for cough and shortness of breath.   Gastrointestinal:  Negative for nausea and vomiting.  Musculoskeletal:  Negative for neck pain and neck stiffness.  Skin:  Negative for rash.  Neurological:  Negative for headaches.   Physical Exam Updated Vital Signs BP 111/72   Pulse 68   Temp 98.7 F (37.1 C) (Oral)   Resp 16   Ht 1.626 m (5\' 4" )   Wt 56.7 kg   SpO2 100%   BMI 21.46 kg/m   Physical Exam Vitals and nursing note reviewed.  Constitutional:      Appearance: Normal appearance. She is well-developed.  HENT:     Head: Atraumatic.     Left Ear:  Tympanic membrane, ear canal and external ear normal.     Ears:     Comments: Right external otitis, mild-mod. Tm normal. No mastoid tenderness.     Nose: Nose normal.     Mouth/Throat:     Mouth: Mucous membranes are moist.  Eyes:     General: No scleral icterus.    Extraocular Movements: Extraocular movements intact.     Pupils: Pupils are equal, round, and reactive to light.     Comments: Right eye conjunctivitis w matting on lashes. No orbital or periorbital cellulitis. Lid everted, no fb.   Neck:     Trachea: No tracheal deviation.  Cardiovascular:     Rate and Rhythm: Normal rate.     Pulses: Normal pulses.  Pulmonary:     Effort: Pulmonary effort is normal. No respiratory distress.  Genitourinary:    Comments: No cva tenderness.  Musculoskeletal:        General: No swelling.     Cervical back: Normal range of motion and neck supple. No rigidity. No muscular tenderness.  Skin:    General: Skin is warm and dry.     Findings: No rash.  Neurological:      Mental Status: She is alert.     Comments: Alert, speech normal.   Psychiatric:        Mood and Affect: Mood normal.    ED Results / Procedures / Treatments   Labs (all labs ordered are listed, but only abnormal results are displayed) Labs Reviewed - No data to display  EKG None  Radiology No results found.  Procedures Procedures   Medications Ordered in ED Medications  tobramycin (TOBREX) 0.3 % ophthalmic solution 2 drop (has no administration in time range)    ED Course  I have reviewed the triage vital signs and the nursing notes.  Pertinent labs & imaging results that were available during my care of the patient were reviewed by me and considered in my medical decision making (see chart for details).    MDM Rules/Calculators/A&P                         Tobrex gtts to right eye.   Reviewed nursing notes and prior charts for additional history.   Rx for home.   Pt is overall well, not toxic appearing and appears stable for d/c.    Final Clinical Impression(s) / ED Diagnoses Final diagnoses:  None    Rx / DC Orders ED Discharge Orders     None        Cathren Laine, MD 04/30/21 1918

## 2021-04-30 NOTE — ED Triage Notes (Signed)
Pt states right eye irritation/drainage and right ear pain and drainage x 2 days

## 2022-06-10 ENCOUNTER — Inpatient Hospital Stay (HOSPITAL_COMMUNITY): Payer: Federal, State, Local not specified - PPO

## 2022-06-10 ENCOUNTER — Inpatient Hospital Stay (HOSPITAL_COMMUNITY)
Admission: AD | Admit: 2022-06-10 | Discharge: 2022-06-10 | Disposition: A | Discharge status: Home / Self Care | Payer: Federal, State, Local not specified - PPO | Attending: Obstetrics & Gynecology | Admitting: Obstetrics & Gynecology

## 2022-06-10 ENCOUNTER — Other Ambulatory Visit: Payer: Self-pay

## 2022-06-10 ENCOUNTER — Encounter (HOSPITAL_COMMUNITY): Payer: Self-pay | Admitting: Obstetrics & Gynecology

## 2022-06-10 DIAGNOSIS — O200 Threatened abortion: Secondary | ICD-10-CM | POA: Insufficient documentation

## 2022-06-10 DIAGNOSIS — Z3491 Encounter for supervision of normal pregnancy, unspecified, first trimester: Secondary | ICD-10-CM

## 2022-06-10 DIAGNOSIS — Z3A01 Less than 8 weeks gestation of pregnancy: Secondary | ICD-10-CM

## 2022-06-10 DIAGNOSIS — Z3A11 11 weeks gestation of pregnancy: Secondary | ICD-10-CM | POA: Diagnosis not present

## 2022-06-10 LAB — URINALYSIS, ROUTINE W REFLEX MICROSCOPIC
Bacteria, UA: NONE SEEN
Bilirubin Urine: NEGATIVE
Glucose, UA: NEGATIVE mg/dL
Ketones, ur: NEGATIVE mg/dL
Leukocytes,Ua: NEGATIVE
Nitrite: NEGATIVE
Protein, ur: NEGATIVE mg/dL
Specific Gravity, Urine: 1.016 (ref 1.005–1.030)
pH: 5 (ref 5.0–8.0)

## 2022-06-10 LAB — WET PREP, GENITAL
Clue Cells Wet Prep HPF POC: NONE SEEN
Sperm: NONE SEEN
Trich, Wet Prep: NONE SEEN
WBC, Wet Prep HPF POC: >=10 — AB (ref <10)
Yeast Wet Prep HPF POC: NONE SEEN

## 2022-06-10 LAB — POCT PREGNANCY, URINE: Preg Test, Ur: POSITIVE — AB

## 2022-06-10 NOTE — MAU Provider Note (Signed)
History     [id number]  Arrival date and time: 06/10/22 1424    Chief Complaint  Patient presents with   Vaginal Bleeding     HPI Mia James is a 26 y.o. at [redacted]w[redacted]d by LMP who presents for vaginal bleeding. Bleeding started last night as spotting. Had to wear a pad over night which was saturated by this morning. Still wearing a pad but reports bleeding has slowed down. Had some abdominal cramping that resolved after taking ibuprofen. Denies n/v/d, dysuria, vaginal discharge, vaginal irritation, or recent intercourse. Had positive pregnancy test last week. Her app says that she's [redacted]w[redacted]d pregnant but also shows that her LMP was 5/1. She can't recall if she had a period in June.   OB History     Gravida  5   Para  3   Term  3   Preterm  0   AB  1   Living  3      SAB  1   IAB  0   Ectopic  0   Multiple  0   Live Births  3           Past Medical History:  Diagnosis Date   Anemia    Eczema    Infection    UTI    Past Surgical History:  Procedure Laterality Date   NO PAST SURGERIES      Family History  Problem Relation Age of Onset   Healthy Mother    Heart disease Maternal Grandmother    Diabetes Maternal Grandfather     No Known Allergies  No current facility-administered medications on file prior to encounter.   Current Outpatient Medications on File Prior to Encounter  Medication Sig Dispense Refill   Prenatal Vit-Fe Fumarate-FA (MULTIVITAMIN-PRENATAL) 27-0.8 MG TABS tablet Take 1 tablet by mouth daily at 12 noon.       ROS Pertinent positives and negative per HPI, all others reviewed and negative  Physical Exam   BP 113/69 (BP Location: Right Arm)   Pulse 82   Temp 98.2 F (36.8 C) (Oral)   Resp 16   Ht 5\' 4"  (1.626 m)   Wt 64 kg   LMP 03/22/2022   SpO2 100% Comment: room air  BMI 24.20 kg/m   Patient Vitals for the past 24 hrs:  BP Temp Temp src Pulse Resp SpO2 Height Weight  06/10/22 1618 113/69 98.2 F (36.8 C)  Oral 82 16 100 % -- --  06/10/22 1506 107/66 -- -- 81 16 100 % -- --  06/10/22 1447 114/70 98.3 F (36.8 C) Oral 77 14 100 % -- --  06/10/22 1443 -- -- -- -- -- -- 5\' 4"  (1.626 m) 64 kg    Physical Exam Vitals and nursing note reviewed. Exam conducted with a chaperone present.  Constitutional:      General: She is not in acute distress.    Appearance: Normal appearance.  HENT:     Head: Normocephalic and atraumatic.  Eyes:     General: No scleral icterus.    Conjunctiva/sclera: Conjunctivae normal.     Pupils: Pupils are equal, round, and reactive to light.  Pulmonary:     Effort: Pulmonary effort is normal. No respiratory distress.  Abdominal:     Palpations: Abdomen is soft.     Tenderness: There is no abdominal tenderness. There is no guarding or rebound.  Genitourinary:    General: Normal vulva.     Uterus: Normal. Not enlarged and not  tender.      Adnexa: Right adnexa normal and left adnexa normal.     Comments: NEFG. Small amount of dark red blood. Cervix pink/smooth/not friable. Cervix closed.  Skin:    General: Skin is warm and dry.  Neurological:     Mental Status: She is alert.       Labs Results for orders placed or performed during the hospital encounter of 06/10/22 (from the past 24 hour(s))  Pregnancy, urine POC     Status: Abnormal   Collection Time: 06/10/22  2:32 PM  Result Value Ref Range   Preg Test, Ur POSITIVE (A) NEGATIVE  Urinalysis, Routine w reflex microscopic Urine, Clean Catch     Status: Abnormal   Collection Time: 06/10/22  2:43 PM  Result Value Ref Range   Color, Urine YELLOW YELLOW   APPearance HAZY (A) CLEAR   Specific Gravity, Urine 1.016 1.005 - 1.030   pH 5.0 5.0 - 8.0   Glucose, UA NEGATIVE NEGATIVE mg/dL   Hgb urine dipstick LARGE (A) NEGATIVE   Bilirubin Urine NEGATIVE NEGATIVE   Ketones, ur NEGATIVE NEGATIVE mg/dL   Protein, ur NEGATIVE NEGATIVE mg/dL   Nitrite NEGATIVE NEGATIVE   Leukocytes,Ua NEGATIVE NEGATIVE   RBC /  HPF 21-50 0 - 5 RBC/hpf   WBC, UA 6-10 0 - 5 WBC/hpf   Bacteria, UA NONE SEEN NONE SEEN   Squamous Epithelial / LPF 0-5 0 - 5   Mucus PRESENT    Hyaline Casts, UA PRESENT   Wet prep, genital     Status: Abnormal   Collection Time: 06/10/22  3:26 PM   Specimen: PATH Cytology Cervicovaginal Ancillary Only  Result Value Ref Range   Yeast Wet Prep HPF POC NONE SEEN NONE SEEN   Trich, Wet Prep NONE SEEN NONE SEEN   Clue Cells Wet Prep HPF POC NONE SEEN NONE SEEN   WBC, Wet Prep HPF POC >=10 (A) <10   Sperm NONE SEEN     Imaging US OB Comp Less 14 Wks  Result Date: 06/10/2022 CLINICAL DATA:  Vaginal bleeding since last night EXAM: OBSTETRIC <14 WK ULTRASOUND TECHNIQUE: Transabdominal ultrasound was performed for evaluation of the gestation as well as the maternal uterus and adnexal regions. COMPARISON:  None Available. FINDINGS: Intrauterine gestational sac: Single Yolk sac:  Visualized. Embryo:  Visualized. Cardiac Activity: Visualized. Heart Rate: 123 bpm CRL:   3.5 mm   6 w 0 d                  [place] EDC: 02/03/2023 Subchorionic hemorrhage:  None visualized. Maternal uterus/adnexae: Unremarkable. IMPRESSION: Single intrauterine gestation at sonographic gestational age of [redacted] weeks, 0 days. Fetal heart rate 123 beats per minute. EDD 02/03/2023. Electronically Signed   By: Jearld Lesch M.D.   On: 06/10/2022 15:52    MAU Course  Procedures Lab Orders         Wet prep, genital         Urinalysis, Routine w reflex microscopic Urine, Clean Catch         Pregnancy, urine POC    No orders of the defined types were placed in this encounter.  Imaging Orders         US OB Comp Less 14 Wks      MDM +UPT UA, wet prep, GC/chlamydia, CBC, ABO/Rh, quant hCG, and [place] today to rule out ectopic pregnancy which can be life threatening.   RH positive  Ultrasound shows live IUP measuring [redacted]w[redacted]d (EDD  updated) Wet prep negative GC/CT pending Assessment and Plan   1. Threatened miscarriage in early  pregnancy  -Pelvic rest -Reviewed bleeding precautions & reasons to return to MAU  2. Normal IUP (intrauterine pregnancy) on prenatal ultrasound, first trimester  -Patient plans on going to birth center in [place] although she reports not making it there in time with her last baby & ended up delivering at home. Would like local provider list for current pregnancy.   3. [redacted] weeks gestation of pregnancy      Judeth Horn, NP 06/10/22 4:22 PM

## 2022-06-10 NOTE — Discharge Instructions (Signed)
?  [facility] Providers  ? ? ? ? ? ?   ?[facility]  [address], [place], [place] [address]  [phone]  ?[facility]  [address], [place], [place] [address]  [phone]  ?[facility]  [address], [place], [place] [address]  [phone]  ?[facility] [place]  [address], [place], [place] [address]  [phone]  ?[facility]  [address] floor), [place], [place] [address]  [phone]  ?[facility]  [address], [place], [place] [address]  [phone]  ?[facility]  [address], [place], [place] [address]  [phone]  ?[facility]  [address], [place], [place] [address]  [phone]  ?[facility]  [address], [place], [place] [address]  [phone]  ?[facility]  [address], [place], [place] [address]  [phone]  ?[facility]  [address], [place], [place] [address]  [phone]  ?[facility]   [address], [place], [place] [address]  [phone]  ?[facility]  [address], , [place] [address]   [phone]  ?[facility]  [address], , [place] [address]  [phone]  ? ? ? ? ? ? ?Safe Medications in Pregnancy  ? ?Acne: ?Benzoyl Peroxide ?Salicylic Acid ? ?Backache/Headache: ?Tylenol: 2 regular strength every 4 hours OR ?             2 Extra strength every 6 hours ? ?Colds/Coughs/Allergies: ?Benadryl (alcohol free) 25 mg every 6 hours as needed ?Breath right strips ?Claritin ?Cepacol throat lozenges ?Chloraseptic throat spray ?Cold-Eeze- up to three times per day ?Cough drops, alcohol free ?Flonase (by prescription only) ?Guaifenesin ?Mucinex ?Robitussin DM (plain only, alcohol free) ?Saline nasal  spray/drops ?Sudafed (pseudoephedrine) & Actifed ** use only after [redacted] weeks gestation and if you do not have high blood pressure ?Tylenol ?Vicks Vaporub ?Zinc lozenges ?Zyrtec  ? ?Constipation: ?Colace ?Ducolax suppositories ?Fleet enema ?Glycerin suppositories ?Metamucil ?Milk of magnesia ?Miralax ?Senokot ?Smooth move tea ? ?Diarrhea: ?Kaopectate ?Imodium A-D ? ?*NO pepto Bismol ? ?Hemorrhoids: ?Anusol ?Anusol HC ?Preparation H ?Tucks ? ?Indigestion: ?Tums ?Maalox ?Mylanta ?Zantac  ?Pepcid ? ?Insomnia: ?Benadryl (alcohol free) 25mg every 6 hours as needed ?Tylenol PM ?Unisom, no Gelcaps ? ?Leg Cramps: ?Tums ?MagGel ? ?Nausea/Vomiting:  ?Bonine ?Dramamine ?Emetrol ?Ginger extract ?Sea bands ?Meclizine  ?Nausea medication to take during pregnancy:  ?Unisom (doxylamine succinate 25 mg tablets) Take one tablet daily at bedtime. If symptoms are not adequately controlled, the dose can be increased to a maximum recommended dose of two tablets daily (1/2 tablet in the morning, 1/2 tablet mid-afternoon and one at bedtime). ?Vitamin B6 100mg tablets. Take one tablet twice a day (up to 200 mg per day). ? ?Skin Rashes: ?Aveeno products ?Benadryl cream or 25mg every 6 hours as needed ?Calamine Lotion ?1% cortisone cream ? ?Yeast infection: ?Gyne-lotrimin 7 ?Monistat 7 ? ?Gum/tooth pain: ?Anbesol ? ?**If taking multiple medications, please check labels to avoid duplicating the same active ingredients ?**take medication as directed on the label ?** Do not exceed 4000 mg of tylenol in 24 hours ?**Do not take medications that contain aspirin or ibuprofen ? ? ? ?

## 2022-06-10 NOTE — MAU Note (Signed)
Mia James is a 26 y.o. at [redacted]w[redacted]d here in MAU reporting: had + UPT last week. Started spotting last night. Overnight had some cramping and increased bleeding. Took ibuprofen so she is currently not having any pain. Bleeding lightened up today.   LMP: 03/22/2022  Onset of complaint: yesterday  Pain score: 0/10 Vitals:   06/10/22 1447  BP: 114/70  Pulse: 77  Resp: 14  Temp: 98.3 F (36.8 C)  SpO2: 100%     Lab orders placed from triage: upt, ua

## 2022-06-11 LAB — GC/CHLAMYDIA PROBE AMP (~~LOC~~) NOT AT ARMC
Chlamydia: NEGATIVE
Comment: NEGATIVE
Comment: NORMAL
Neisseria Gonorrhea: NEGATIVE

## 2023-10-10 NOTE — Progress Notes (Signed)
 [facility]  - Family Medicine Myra Master  Date of Service: 10/10/2023 Patient Name: Mia James Patient DOB: Oct 16, 1996    Subjective:   Patient comes in for Establish Care .   HPI This is a new patient that presents to establish care. Patient would also like a referral to a dermatologist. Patient states she's been having breakout occurrences on her face and believes it's cystic acne. She also feels she is having ingrown hair in vaginal area due to shaving.   Pt recently had a miscarriage. Was only about 6-8 weeks. She would like her labs checked. She is no longer bleeding. Menstrual cycle has not returned to normal.   Patient's last menstrual period was 07/06/2023 (exact date).   Last PCP: N/A  Past medical History:  Specialist: Gloris Hugger, OBGYN  Colonoscopy: N/A  LMP: 07/06/2023 Last mammo: Never Last Pap smear: 2018  Today's PHQ-2 results: Patient Health Questionnaire-2 Score: 0 (10/10/23 0954) Today's PHQ-9 result:   PHQ-9 Question # 9   Interpretation: PHQ-2 Interpretation: Negative (None-minimal Depression Severity) (10/10/23 0954)   Depression Plan: Normal/Negative Screening   Review of Systems Review of Systems  Respiratory:  Negative for cough and shortness of breath.   Cardiovascular:  Negative for chest pain.  Gastrointestinal:  Negative for abdominal pain.  Musculoskeletal:  Negative for back pain.  Neurological:  Negative for headaches.    The following portions of the patient's history were reviewed and updated as appropriate: allergies, current medications, PMH/PSH, past social history and problem list.    Past Medical/Surgical History:   History reviewed. No pertinent past medical history. History reviewed. No pertinent surgical history.  Family History:   Family History  Problem Relation Name Age of Onset  . Alcohol abuse Mother      Social History:   Social History   Socioeconomic History   . Marital status: Married    Spouse name: None  . Number of children: None  . Years of education: None  . Highest education level: None  Occupational History  . None  Tobacco Use  . Smoking status: Never  . Smokeless tobacco: Never  Vaping Use  . Vaping status: Never Used  Substance and Sexual Activity  . Alcohol use: Not Currently  . Drug use: Never  . Sexual activity: Yes    Partners: Male  Other Topics Concern  . None  Social History Narrative  . None   Social Determinants of Health   Food Insecurity: Low Risk  (10/10/2023)   Food vital sign   . Within the past 12 months, you worried that your food would run out before you got money to buy more: Never true   . Within the past 12 months, the food you bought just didn't last and you didn't have money to get more: Never true  Transportation Needs: No Transportation Needs (10/10/2023)   Transportation   . In the past 12 months, has lack of reliable transportation kept you from medical appointments, meetings, work or from getting things needed for daily living? : No  Safety: Not on file  Living Situation: Low Risk  (10/10/2023)   Living Situation   . What is your living situation today?: I have a steady place to live   . Think about the place you live. Do you have problems with any of the following? Choose all that apply:: None/None on this list   Social History   Tobacco Use  Smoking Status Never  Smokeless Tobacco  Never     Allergies:   Patient has no known allergies.  Current Medications:   Current Outpatient Medications  Medication Sig Dispense Refill  . mv-min/iron/folic/calcium/vitK (WOMEN'S MULTIVITAMIN ORAL) Take by mouth.    . doxycycline (VIBRAMYCIN) 100 mg capsule Take 1 capsule (100 mg total) by mouth 2 (two) times a day for 10 days. Take with 8 oz water. Do not lie down for at least 30 minutes after. 20 capsule 0   No current facility-administered medications for this visit.     Objective:    Vital Signs BP 100/80 (BP Location: Left arm, Patient Position: Sitting)   Pulse 76   Temp 97.7 F (36.5 C) (Temporal)   Ht 1.626 m (5' 4)   Wt 62.9 kg (138 lb 11.2 oz)   LMP 07/06/2023 (Exact Date) Comment: pt was pregnant but then miscarried in October  SpO2 98%   BMI 23.81 kg/m   BP Readings from Last 3 Encounters:  10/10/23 100/80   Wt Readings from Last 3 Encounters:  10/10/23 62.9 kg (138 lb 11.2 oz)   Patient's last menstrual period was 07/06/2023 (exact date).  Physical Exam Vitals reviewed.  Constitutional:      Appearance: Normal appearance.  Cardiovascular:     Rate and Rhythm: Normal rate and regular rhythm.  Pulmonary:     Effort: Pulmonary effort is normal.     Breath sounds: Normal breath sounds.  Skin:    Findings: Acne present.  Neurological:     Mental Status: She is alert.      Assessment/Plan:    [name] was seen today for establish care.  Diagnoses and all orders for this visit:  Other acne -     Ambulatory Referral to Dermatology; Future -     doxycycline (VIBRAMYCIN) 100 mg capsule; Take 1 capsule (100 mg total) by mouth 2 (two) times a day for 10 days. Take with 8 oz water. Do not lie down for at least 30 minutes after.  Ingrown hair -     Ambulatory Referral to Dermatology; Future  History of iron deficiency anemia -     Anemia Profile -     CBC with Differential -     TSH -     Vitamin D, 25-Hydroxy -     Vitamin B12  History of miscarriage -     TSH -     Vitamin D, 25-Hydroxy -     Vitamin B12  Encounter to establish care   -Will start Doxy for acne. Ref to [employer]. Discussed using gentle cleansing regimen  -Labs for baseline -F/U sooner if cycle does not return to normal next month.  -F/U as sch for CPE   Patient verbalizes understanding and in agreement with the above plan. All questions answered.    Medication side effects discussed with patient. Advised patient to call clinic or return for visit if these  symptoms occur.   Goals of care discussed with patient including med compliance and adequate follow up.  Return in about 3 months (around 01/10/2024) for CPE.    This document was created using the aid of voice recognition Scientist, clinical (histocompatibility and immunogenetics).   Tari ONEIDA Banter, PA-C

## 2024-01-10 NOTE — Progress Notes (Signed)
 [facility]  - Family Medicine [place]  Date of Service: 01/10/2024 Patient DOB: February 07, 1996    HPI   Mia James presents today for complete physical exam. Patient voices complaints  -acne improved some with Doxycycline. Has not sch with Derm yet.    The patient is Married and has 3 children. General health since the last visit is described as feels well, no complaints. Patient's last menstrual period was 12/29/2023 (exact date). Date of last pap smear:  2018.   History abnormal pap smear:  No Date of last mammogram: N/A.  Colorectal cancer screening: N/A Dental visit within the last year: Yes Eye exam within the last year:  Yes Hearing: normal. Current diet: Regular Diet Exercise:  Yes, 4x/week Weight concerns: No  Sexually active: Yes. Urinary incontinence: No. Last TDAP:No.   Today's PHQ-2 results: Patient Health Questionnaire-2 Score: 0 (01/10/24 1119) Today's PHQ-9 result:   PHQ-9 Question # 9   Interpretation: PHQ-2 Interpretation: Negative (None-minimal Depression Severity) (01/10/24 1119)   Depression Plan: Normal/Negative Screening   Review of Systems   Review of Systems  Respiratory:  Negative for cough and shortness of breath.   Cardiovascular:  Negative for chest pain.  Gastrointestinal:  Negative for abdominal pain.  Musculoskeletal:  Negative for back pain.  Skin:        acne  Neurological:  Negative for headaches.     The following portions of the patient history were reviewed and updated, as appropriate: Allergies, current medications, past medical history, past surgical history, past social history, past family history and problem list  Medical History   History reviewed. No pertinent past medical history. History reviewed. No pertinent surgical history. Family History  Problem Relation Name Age of Onset  . Alcohol abuse Mother     Social History   Socioeconomic History  . Marital status: Married     Spouse name: None  . Number of children: None  . Years of education: None  . Highest education level: None  Occupational History  . None  Tobacco Use  . Smoking status: Never  . Smokeless tobacco: Never  Vaping Use  . Vaping status: Never Used  Substance and Sexual Activity  . Alcohol use: Not Currently  . Drug use: Never  . Sexual activity: Yes    Partners: Male  Other Topics Concern  . None  Social History Narrative  . None   Social Drivers of Health   Food Insecurity: Low Risk  (10/10/2023)   Food vital sign   . Within the past 12 months, you worried that your food would run out before you got money to buy more: Never true   . Within the past 12 months, the food you bought just didn't last and you didn't have money to get more: Never true  Transportation Needs: No Transportation Needs (10/10/2023)   Transportation   . In the past 12 months, has lack of reliable transportation kept you from medical appointments, meetings, work or from getting things needed for daily living? : No  Safety: Not on file  Living Situation: Low Risk  (10/10/2023)   Living Situation   . What is your living situation today?: I have a steady place to live   . Think about the place you live. Do you have problems with any of the following? Choose all that apply:: None/None on this list   Social History   Tobacco Use  Smoking Status Never  Smokeless Tobacco Never  No Known Allergies  Immunization History  Administered Date(s) Administered  . Moderna SARS-CoV-2 Primary Series 12+ yrs 11/21/2020, 12/22/2020    Physical Exam      Vitals:   01/10/24 1125  BP: 104/68  BP Location: Left arm  Patient Position: Sitting  Pulse: 91  Temp: 97.9 F (36.6 C)  TempSrc: Temporal  SpO2: 97%  Weight: 63.3 kg (139 lb 8 oz)  Height: 1.626 m (5' 4)    Constitutional: Well-developed and well-nourished. Sitting comfortably conversing normally. Pleasant.  Skin: Skin is warm and dry. Acne on  face and back  Eyes: Conjunctivae clear and pink, no pallor, EOM intact. Pupils are equal, round, and reactive to light. Anicteric. Ears, Mouth, Nose:  External and internal auditory canals are normal. Tympanic membranes with normal light reflex without erythema. Normal nasal mucosa.  Oropharynx is normal, no erythema or exudates, uvula midline.  Lymphatics: There is no palpable cervical or supraclavicular adenopathy.  Neck: Supple, full range of motion. Non-tender thyroid, no thyromegaly. No JVD. Cardiovascular: +S1S2, regular rate and rhythm, no murmurs, gallops or rubs appreciated. No carotid bruits. Respiratory: Normal effort, clear to auscultation bilaterally. No wheezes, rales or rhonchi noted.  Breasts: symmetric, no masses or nodules palpated, no surrounding axillary or supraclavicular lymphadenopathy. Skin overlying breasts normal. No discharge or bleeding from nipples bilaterally. Gastrointestinal: Bowel sounds present and normal. Soft and nontender to palpation. There is no hepatosplenomegaly. No rebound, guarding or fluid wave. No CVA tenderness bilaterally. Musculoskeletal:  Upper and Lower extremities are symmetric with grossly normal muscle strength and tone with normal range of motion.  Peripheral Vascular: No cyanosis, clubbing or edema. Pulses 2+ all 4 extremities. Neuro: Alert and oriented x 3. Cranial nerves II-XII grossly intact. Normal sensation and motor of all extremities. DTRs 2+ bilateral upper and lower extremities. Normal gait. Strength 5/5 in bilateral upper and lower extremities.  Psychiatric: Behavior is Cooperative and Polite. Mood euthymyic. Affect is appropriate.  Assessment/Plan  -Ref to Derm for acne -Refill Doxy for acne -Ref to GYN  - Eat healthy foods. Choose fruits, vegetables, whole grains, protein, and low-fat dairy foods. Limit fat, especially saturated fat. Reduce salt in your diet. - Get at least 30 minutes of physical activity on most days of the  week - Labs ordered below - Follow up in 1 year for CPE.    Normal Female Exam -doing well overall - see orders below  Mia James was seen today for annual exam.  Diagnoses and all orders for this visit:  Annual physical exam -     CBC with Differential -     Comprehensive Metabolic Panel  Encounter for Papanicolaou smear for cervical cancer screening -     Ambulatory referral to Obstetrics / Gynecology; Future  Iron deficiency anemia secondary to inadequate dietary iron intake -     Anemia Profile  Vitamin D deficiency -     Vitamin D, 25-Hydroxy  Other acne -     Ambulatory Referral to Dermatology; Future -     doxycycline (VIBRAMYCIN) 100 mg capsule; Take 1 capsule (100 mg total) by mouth 2 (two) times a day for 10 days. Take with 8 oz water. Do not lie down for at least 30 minutes after.  Encounter for screening for diabetes mellitus -     Hemoglobin A1C With Estimated Average Glucose  Encounter for lipid screening for cardiovascular disease -     Lipid Panel    Patient verbalizes understanding and in agreement with the above plan.  All questions answered.    Medication side effects discussed with patient. Advised patient to call clinic or return for visit if these symptoms occur.   Goals of care discussed with patient including med compliance and adequate follow up.  Return in about 1 year (around 01/09/2025) for CPE.     This document was created using the aid of voice recognition Scientist, clinical (histocompatibility and immunogenetics).   Tari ONEIDA Banter, PA-C  This document serves as a record of services personally performed by Tari Banter PA-C.  It was created on their behalf by Delon Crooked, CMA, a trained medical scribe, and Certified Medical Assistant (CMA). During the course of documenting the history, physical exam and medical decision making, I was functioning as a Stage manager. The creation of this record is the provider's dictation and/or activities during the  visit.  Electronically signed by Delon Crooked, CMA 01/10/2024 11:16 AM  I agree the documentation is accurate and complete.  Electronically signed by: Tari ONEIDA Banter, PA-C 01/10/2024 11:29 AM

## 2024-08-16 ENCOUNTER — Encounter (HOSPITAL_COMMUNITY): Payer: Self-pay | Admitting: Obstetrics and Gynecology

## 2024-08-16 ENCOUNTER — Other Ambulatory Visit: Payer: Self-pay

## 2024-08-16 ENCOUNTER — Inpatient Hospital Stay (HOSPITAL_COMMUNITY)
Admission: AD | Admit: 2024-08-16 | Discharge: 2024-08-16 | Disposition: A | Discharge status: Home / Self Care | Attending: Obstetrics and Gynecology | Admitting: Obstetrics and Gynecology

## 2024-08-16 DIAGNOSIS — R519 Headache, unspecified: Secondary | ICD-10-CM

## 2024-08-16 DIAGNOSIS — Z3A08 8 weeks gestation of pregnancy: Secondary | ICD-10-CM | POA: Diagnosis not present

## 2024-08-16 DIAGNOSIS — M545 Low back pain, unspecified: Secondary | ICD-10-CM | POA: Diagnosis present

## 2024-08-16 DIAGNOSIS — O26891 Other specified pregnancy related conditions, first trimester: Secondary | ICD-10-CM

## 2024-08-16 DIAGNOSIS — G43009 Migraine without aura, not intractable, without status migrainosus: Secondary | ICD-10-CM | POA: Diagnosis present

## 2024-08-16 DIAGNOSIS — O219 Vomiting of pregnancy, unspecified: Secondary | ICD-10-CM

## 2024-08-16 LAB — URINALYSIS, ROUTINE W REFLEX MICROSCOPIC
Bilirubin Urine: NEGATIVE
Glucose, UA: NEGATIVE mg/dL
Hgb urine dipstick: NEGATIVE
Ketones, ur: 20 mg/dL — AB
Leukocytes,Ua: NEGATIVE
Nitrite: NEGATIVE
Protein, ur: NEGATIVE mg/dL
Specific Gravity, Urine: 1.023 (ref 1.005–1.030)
pH: 6 (ref 5.0–8.0)

## 2024-08-16 LAB — POCT PREGNANCY, URINE
Preg Test, Ur: POSITIVE — AB
Preg Test, Ur: POSITIVE — AB

## 2024-08-16 MED ORDER — LACTATED RINGERS IV BOLUS
1000.0000 mL | Freq: Once | INTRAVENOUS | Status: AC
  Administered 2024-08-16: 1000 mL via INTRAVENOUS

## 2024-08-16 MED ORDER — METOCLOPRAMIDE HCL 10 MG PO TABS
10.0000 mg | ORAL_TABLET | Freq: Three times a day (TID) | ORAL | 2 refills | Status: AC | PRN
Start: 2024-08-16 — End: ?

## 2024-08-16 MED ORDER — METOCLOPRAMIDE HCL 5 MG/ML IJ SOLN
10.0000 mg | Freq: Once | INTRAMUSCULAR | Status: AC
  Administered 2024-08-16: 10 mg via INTRAVENOUS
  Filled 2024-08-16: qty 2

## 2024-08-16 MED ORDER — DIPHENHYDRAMINE HCL 50 MG/ML IJ SOLN
25.0000 mg | Freq: Once | INTRAMUSCULAR | Status: AC
  Administered 2024-08-16: 25 mg via INTRAVENOUS
  Filled 2024-08-16: qty 1

## 2024-08-16 MED ORDER — FAMOTIDINE IN NACL 20-0.9 MG/50ML-% IV SOLN
20.0000 mg | Freq: Once | INTRAVENOUS | Status: AC
  Administered 2024-08-16: 20 mg via INTRAVENOUS
  Filled 2024-08-16: qty 50

## 2024-08-16 NOTE — Discharge Instructions (Signed)
 For prevention of migraines in pregnancy: -Magnesium, 400mg  by mouth, once daily -Vitamin B2, 400mg  by mouth, once daily  For treatment of migraines in pregnancy: -take medication at the first sign of the pain of a headache, or the first sign of your aura -start with 1000mg  Tylenol  (or excedrin tension headache with acetaminophen  & caffeine only, NOT aspirin)  -if no relief after 1-2hours, can take reglan  10 mg -Do not take more than 4000 mg of tylenol  (acetaminophen ) per day

## 2024-08-16 NOTE — MAU Provider Note (Signed)
 History     CSN: [id number]  Arrival date and time: 08/16/24 1505   Event Date/Time   First Provider Initiated Contact with Patient 08/16/24 1626      Chief Complaint  Patient presents with   Possible Pregnancy   Migraine   Emesis   HPI Mia James is a 28 y.o. [id number] at [redacted]w[redacted]d by unsure LMP who presents for n/v & headache.  Reports nausea and vomiting since the beginning of the pregnancy.  Symptoms worsened this morning.  States she has vomited 6+ times and unable to eat or drink.  Does not have antiemetic at home.  Since this morning has had a frontal headache.  Endorses photophobia.  Has not treated headache due to nausea.  Denies abdominal pain, diarrhea, vaginal bleeding.  OB History     Gravida  5   Para  3   Term  3   Preterm  0   AB  1   Living  3      SAB  1   IAB  0   Ectopic  0   Multiple  0   Live Births  3           Past Medical History:  Diagnosis Date   Anemia    Eczema    Infection    UTI    Past Surgical History:  Procedure Laterality Date   NO PAST SURGERIES      Family History  Problem Relation Age of Onset   Healthy Mother    Heart disease Maternal Grandmother    Diabetes Maternal Grandfather     Social History   Tobacco Use   Smoking status: Never   Smokeless tobacco: Never  Vaping Use   Vaping status: Never Used  Substance Use Topics   Alcohol use: No   Drug use: No    Allergies: No Known Allergies  Medications Prior to Admission  Medication Sig Dispense Refill Last Dose/Taking   Prenatal Vit-Fe Fumarate-FA (MULTIVITAMIN-PRENATAL) 27-0.8 MG TABS tablet Take 1 tablet by mouth daily at 12 noon.   Past Week    Review of Systems  All other systems reviewed and are negative.  Physical Exam   Blood pressure 106/73, pulse 86, temperature 98.1 F (36.7 C), temperature source Oral, resp. rate 15, height 5' 4 (1.626 m), weight 63 kg, last menstrual period 06/21/2024, SpO2 100%, unknown if currently  breastfeeding.  Physical Exam Vitals and nursing note reviewed.  Constitutional:      General: She is not in acute distress.    Appearance: She is well-developed. She is not ill-appearing.  HENT:     Head: Normocephalic and atraumatic.  Eyes:     General: No scleral icterus.       Right eye: No discharge.        Left eye: No discharge.     Conjunctiva/sclera: Conjunctivae normal.  Pulmonary:     Effort: Pulmonary effort is normal. No respiratory distress.  Neurological:     General: No focal deficit present.     Mental Status: She is alert.  Psychiatric:        Mood and Affect: Mood normal.        Behavior: Behavior normal.     MAU Course  Procedures Results for orders placed or performed during the hospital encounter of 08/16/24 (from the past 24 hours)  Pregnancy, urine POC     Status: Abnormal   Collection Time: 08/16/24  4:17 PM  Result Value Ref Range  Preg Test, Ur POSITIVE (A) NEGATIVE  Pregnancy, urine POC     Status: Abnormal   Collection Time: 08/16/24  5:16 PM  Result Value Ref Range   Preg Test, Ur POSITIVE (A) NEGATIVE    MDM  moderate  Assessment and Plan   1. Nausea and vomiting during pregnancy  -Treated with IV reglan  & pepcid. Symptoms resolved -Rx reglan   2. Headache in pregnancy, antepartum, first trimester  -Tx with IV reglan  & benadryl . Headache resolved -Discussed tx of headaches at home including tylenol , reglan /benadryl , & hydration  3. [redacted] weeks gestation of pregnancy  -Denies abdominal pain of vaginal bleeding -Waiting on OB referral from her PCP to [facility]     Rocky Satterfield 08/16/2024, 5:51 PM

## 2024-08-16 NOTE — MAU Note (Signed)
 Mia James is a 28 y.o. at Unknown here in MAU reporting: +HPT on September 10th  Migraine started this am. Does not have hx of HA or migraines. Nausea/vomiting started this morning as well. States she has had N/V every day but today was different. Denies any abdominal pain or VB. Reports lower back pain that is constant. Has not taken any medication for headache today. Reports 6 episodes of vomiting today. Has not had an OB appointment yet and does not have one scheduled.   LMP: 06/21/24, unsure  Onset of complaint: today Pain score: 5 Vitals:   08/16/24 1544  BP: 108/71  Pulse: 81  Resp: 15  Temp: 98.1 F (36.7 C)  SpO2: 100%     FHT:n/a Lab orders placed from triage:  UA, pregnancy test
